# Patient Record
Sex: Female | Born: 1990 | Race: Black or African American | Hispanic: No | Marital: Married | Smoking: Never smoker
Health system: Southern US, Community
[De-identification: ages and names within clinical notes are randomized; demographics above are authoritative.]

## PROBLEM LIST (undated history)

## (undated) DIAGNOSIS — Z789 Other specified health status: Secondary | ICD-10-CM

## (undated) HISTORY — PX: NO PAST SURGERIES: SHX2092

---

## 2018-03-15 ENCOUNTER — Encounter (HOSPITAL_BASED_OUTPATIENT_CLINIC_OR_DEPARTMENT_OTHER): Payer: Self-pay | Admitting: Adult Health

## 2018-03-15 ENCOUNTER — Other Ambulatory Visit: Payer: Self-pay

## 2018-03-15 DIAGNOSIS — B349 Viral infection, unspecified: Secondary | ICD-10-CM | POA: Insufficient documentation

## 2018-03-15 NOTE — ED Triage Notes (Signed)
Three days of flu like illness. She took Ibuprfen at 9 for a fever of 102.0 Boyfriend with same sx.

## 2018-03-16 ENCOUNTER — Emergency Department (HOSPITAL_BASED_OUTPATIENT_CLINIC_OR_DEPARTMENT_OTHER)
Admission: EM | Admit: 2018-03-16 | Discharge: 2018-03-16 | Disposition: A | Discharge status: Home / Self Care | Payer: Self-pay | Attending: Emergency Medicine | Admitting: Emergency Medicine

## 2018-03-16 DIAGNOSIS — B349 Viral infection, unspecified: Secondary | ICD-10-CM

## 2018-03-16 NOTE — ED Provider Notes (Signed)
  [facility] EMERGENCY DEPARTMENT Provider Note   CSN: [id number] Arrival date & time: 03/15/18  2137     History   Chief Complaint Chief Complaint  Patient presents with  . Influenza    HPI Lindsey Long is a 27 y.o. female.  The history is provided by the patient.  Influenza  Presenting symptoms: cough, fatigue, fever, myalgias and sore throat   Presenting symptoms: no diarrhea   Severity:  Moderate Onset quality:  Gradual Duration:  32 days Progression:  Worsening Chronicity:  New Relieved by:  Nothing Worsened by:  Nothing Patient reports flulike illness for 3 days.  She reports fever chills.  She has sick contacts.  No travel.  PMH-none Soc hx - nonsmoker OB History   None      Home Medications    Prior to Admission medications   Not on File    Family History History reviewed. No pertinent family history.  Social History Social History   Tobacco Use  . Smoking status: Not on file  Substance Use Topics  . Alcohol use: Not on file  . Drug use: Not on file     Allergies   Patient has no known allergies.   Review of Systems Review of Systems  Constitutional: Positive for fatigue and fever.  HENT: Positive for sore throat.   Respiratory: Positive for cough.   Gastrointestinal: Negative for diarrhea.  Musculoskeletal: Positive for myalgias.  All other systems reviewed and are negative.    Physical Exam Updated Vital Signs BP 130/86   Pulse 69   Temp 98.3 F (36.8 C) (Oral)   Resp 18   Ht 1.651 m (5\' 5" )   Wt 109.8 kg   LMP 03/03/2018 (Exact Date)   SpO2 99%   BMI 40.27 kg/m   Physical Exam CONSTITUTIONAL: Well developed/well nourished HEAD: Normocephalic/atraumatic EYES: EOMI/PERRL ENMT: Mucous membranes moist, uvula midline, no erythema or exudate NECK: supple no meningeal signs SPINE/BACK:entire spine nontender CV: S1/S2 noted, no murmurs/rubs/gallops noted LUNGS: Lungs are clear to auscultation  bilaterally, no apparent distress ABDOMEN: soft, nontender, no rebound or guarding, bowel sounds noted throughout abdomen GU:no cva tenderness NEURO: Pt is awake/alert/appropriate, moves all extremitiesx4.  No facial droop.   EXTREMITIES: pulses normal/equal, full ROM SKIN: warm, color normal PSYCH: no abnormalities of mood noted, alert and oriented to situation   ED Treatments / Results  Labs (all labs ordered are listed, but only abnormal results are displayed) Labs Reviewed - No data to display  EKG None  Radiology No results found.  Procedures Procedures (including critical care time)  Medications Ordered in ED Medications - No data to display   Initial Impression / Assessment and Plan / ED Course  I have reviewed the triage vital signs and the nursing notes.      Suspect viral illness.  Lung sounds are clear.  Vitals appropriate.  Discharge home.  We discussed strict return precautions  Final Clinical Impressions(s) / ED Diagnoses   Final diagnoses:  Viral syndrome    ED Discharge Orders    None       Zadie Rhine, MD 03/16/18 [phone]

## 2018-03-16 NOTE — ED Notes (Signed)
ED Provider at bedside. 

## 2018-03-16 NOTE — ED Notes (Signed)
Pt understood dc material. NAD noted. 

## 2020-01-28 ENCOUNTER — Encounter (HOSPITAL_BASED_OUTPATIENT_CLINIC_OR_DEPARTMENT_OTHER): Payer: Self-pay

## 2020-01-28 ENCOUNTER — Other Ambulatory Visit: Payer: Self-pay

## 2020-01-28 ENCOUNTER — Emergency Department (HOSPITAL_BASED_OUTPATIENT_CLINIC_OR_DEPARTMENT_OTHER)
Admission: EM | Admit: 2020-01-28 | Discharge: 2020-01-28 | Disposition: A | Discharge status: Home / Self Care | Payer: Self-pay | Attending: Emergency Medicine | Admitting: Emergency Medicine

## 2020-01-28 DIAGNOSIS — Z5321 Procedure and treatment not carried out due to patient leaving prior to being seen by health care provider: Secondary | ICD-10-CM | POA: Insufficient documentation

## 2020-01-28 DIAGNOSIS — R519 Headache, unspecified: Secondary | ICD-10-CM | POA: Insufficient documentation

## 2020-01-28 DIAGNOSIS — R109 Unspecified abdominal pain: Secondary | ICD-10-CM | POA: Insufficient documentation

## 2020-01-28 DIAGNOSIS — R197 Diarrhea, unspecified: Secondary | ICD-10-CM | POA: Insufficient documentation

## 2020-01-28 LAB — URINALYSIS, ROUTINE W REFLEX MICROSCOPIC
Bilirubin Urine: NEGATIVE
Glucose, UA: NEGATIVE mg/dL
Hgb urine dipstick: NEGATIVE
Ketones, ur: NEGATIVE mg/dL
Leukocytes,Ua: NEGATIVE
Nitrite: NEGATIVE
Protein, ur: NEGATIVE mg/dL
Specific Gravity, Urine: 1.02 (ref 1.005–1.030)
pH: 7.5 (ref 5.0–8.0)

## 2020-01-28 LAB — PREGNANCY, URINE: Preg Test, Ur: NEGATIVE

## 2020-01-28 NOTE — ED Triage Notes (Signed)
Pt c/o abd pain/diarrhea started last night-HA x 3 days-NAD-steady gait

## 2021-05-27 ENCOUNTER — Emergency Department (HOSPITAL_BASED_OUTPATIENT_CLINIC_OR_DEPARTMENT_OTHER)
Admission: EM | Admit: 2021-05-27 | Discharge: 2021-05-27 | Disposition: A | Discharge status: Home / Self Care | Payer: Self-pay | Attending: Emergency Medicine | Admitting: Emergency Medicine

## 2021-05-27 ENCOUNTER — Emergency Department (HOSPITAL_BASED_OUTPATIENT_CLINIC_OR_DEPARTMENT_OTHER): Payer: Self-pay

## 2021-05-27 ENCOUNTER — Other Ambulatory Visit: Payer: Self-pay

## 2021-05-27 ENCOUNTER — Encounter (HOSPITAL_BASED_OUTPATIENT_CLINIC_OR_DEPARTMENT_OTHER): Payer: Self-pay | Admitting: *Deleted

## 2021-05-27 DIAGNOSIS — R52 Pain, unspecified: Secondary | ICD-10-CM

## 2021-05-27 DIAGNOSIS — R1031 Right lower quadrant pain: Secondary | ICD-10-CM | POA: Insufficient documentation

## 2021-05-27 DIAGNOSIS — O26891 Other specified pregnancy related conditions, first trimester: Secondary | ICD-10-CM | POA: Insufficient documentation

## 2021-05-27 DIAGNOSIS — Z3A08 8 weeks gestation of pregnancy: Secondary | ICD-10-CM | POA: Insufficient documentation

## 2021-05-27 LAB — URINALYSIS, ROUTINE W REFLEX MICROSCOPIC
Bilirubin Urine: NEGATIVE
Glucose, UA: NEGATIVE mg/dL
Hgb urine dipstick: NEGATIVE
Ketones, ur: 40 mg/dL — AB
Leukocytes,Ua: NEGATIVE
Nitrite: NEGATIVE
Protein, ur: NEGATIVE mg/dL
Specific Gravity, Urine: 1.02 (ref 1.005–1.030)
pH: 7 (ref 5.0–8.0)

## 2021-05-27 LAB — COMPREHENSIVE METABOLIC PANEL
ALT: 12 U/L (ref 0–44)
AST: 20 U/L (ref 15–41)
Albumin: 4 g/dL (ref 3.5–5.0)
Alkaline Phosphatase: 50 U/L (ref 38–126)
Anion gap: 10 (ref 5–15)
BUN: 6 mg/dL (ref 6–20)
CO2: 22 mmol/L (ref 22–32)
Calcium: 9.4 mg/dL (ref 8.9–10.3)
Chloride: 103 mmol/L (ref 98–111)
Creatinine, Ser: 0.73 mg/dL (ref 0.44–1.00)
GFR, Estimated: >60 mL/min (ref >60)
Glucose, Bld: 84 mg/dL (ref 70–99)
Potassium: 3.3 mmol/L — ABNORMAL LOW (ref 3.5–5.1)
Sodium: 135 mmol/L (ref 135–145)
Total Bilirubin: 0.5 mg/dL (ref 0.3–1.2)
Total Protein: 8 g/dL (ref 6.5–8.1)

## 2021-05-27 LAB — CBC WITH DIFFERENTIAL/PLATELET
Abs Immature Granulocytes: 0.01 10*3/uL (ref 0.00–0.07)
Basophils Absolute: 0 10*3/uL (ref 0.0–0.1)
Basophils Relative: 0 %
Eosinophils Absolute: 0.1 10*3/uL (ref 0.0–0.5)
Eosinophils Relative: 1 %
HCT: 39.8 % (ref 36.0–46.0)
Hemoglobin: 13.6 g/dL (ref 12.0–15.0)
Immature Granulocytes: 0 %
Lymphocytes Relative: 30 %
Lymphs Abs: 1.6 10*3/uL (ref 0.7–4.0)
MCH: 28.4 pg (ref 26.0–34.0)
MCHC: 34.2 g/dL (ref 30.0–36.0)
MCV: 83.1 fL (ref 80.0–100.0)
Monocytes Absolute: 0.4 10*3/uL (ref 0.1–1.0)
Monocytes Relative: 8 %
Neutro Abs: 3.1 10*3/uL (ref 1.7–7.7)
Neutrophils Relative %: 61 %
Platelets: 224 10*3/uL (ref 150–400)
RBC: 4.79 MIL/uL (ref 3.87–5.11)
RDW: 13.5 % (ref 11.5–15.5)
WBC: 5.1 10*3/uL (ref 4.0–10.5)
nRBC: 0 % (ref 0.0–0.2)

## 2021-05-27 LAB — HCG, QUANTITATIVE, PREGNANCY: hCG, Beta Chain, Quant, S: 137796 m[IU]/mL — ABNORMAL HIGH (ref <5)

## 2021-05-27 LAB — PREGNANCY, URINE: Preg Test, Ur: POSITIVE — AB

## 2021-05-27 LAB — ABO/RH: ABO/RH(D): O POS

## 2021-05-27 LAB — LIPASE, BLOOD: Lipase: 28 U/L (ref 11–51)

## 2021-05-27 MED ORDER — PRENATAL COMPLETE 14-0.4 MG PO TABS: 1.0000 | ORAL_TABLET | Freq: Every day | ORAL | 0 refills | Status: AC

## 2021-05-27 NOTE — ED Triage Notes (Signed)
Pt reports pain in right side of abdomen/groin that started after she lifted an industrial size trash can lid on Tuesday. Denies n/v/d. States pain is worse with movement

## 2021-05-27 NOTE — Discharge Instructions (Signed)
You were seen in the emergency room today with abdominal pain.  We found you to be pregnant.  The ultrasound shows that the baby is in the correct location and is estimated to be about [redacted] weeks along.  If you develop any fever, burning urination, vaginal discharge or worsening discomfort you should return to the emergency department.  Please establish care with an OB/GYN.  I have attached a prescription for prenatal vitamins.  Return with any new or sudden worsening symptoms.

## 2021-05-27 NOTE — ED Provider Notes (Signed)
Emergency Department Provider Note   I have reviewed the triage vital signs and the nursing notes.   HISTORY  Chief Complaint Groin Pain   HPI Lindsey Long is a 30 y.o. female with past history reviewed below presents the emergency department with right lower quadrant soreness.  Symptoms developed over the last 5 days.  She associates the start of pain with lifting a heavy trash can lid on Tuesday.  She has occasional soreness in that area which is worse with movement although denies that the pain is severe.  No radiation of symptoms.  No vaginal bleeding or discharge.  No concern for sexually transmitted infection.  No dysuria, hesitancy, urgency.  Denies any chest pain radiation into the flank. No prior pregnancy. Last menstrual cycle was in early October.   History reviewed. No pertinent past medical history.  There are no problems to display for this patient.   History reviewed. No pertinent surgical history.  Allergies Patient has no known allergies.  No family history on file.  Social History Social History   Tobacco Use   Smoking status: Never   Smokeless tobacco: Never  Vaping Use   Vaping Use: Never used  Substance Use Topics   Alcohol use: Never   Drug use: Never    Review of Systems  Constitutional: No fever/chills Eyes: No visual changes. ENT: No sore throat. Cardiovascular: Denies chest pain. Respiratory: Denies shortness of breath. Gastrointestinal: Positive RLQ abdominal pain.  No nausea, no vomiting.  No diarrhea.  No constipation. Genitourinary: Negative for dysuria. Musculoskeletal: Negative for back pain. Skin: Negative for rash. Neurological: Negative for headaches, focal weakness or numbness.  10-point ROS otherwise negative.  ____________________________________________   PHYSICAL EXAM:  VITAL SIGNS: ED Triage Vitals  Enc Vitals Group     BP 05/27/21 1417 127/75     Pulse Rate 05/27/21 1417 75     Resp 05/27/21 1417 16      Temp 05/27/21 1417 99.1 F (37.3 C)     Temp Source 05/27/21 1417 Oral     SpO2 05/27/21 1417 100 %     Weight 05/27/21 1417 230 lb (104.3 kg)     Height 05/27/21 1417 5\' 5"  (1.651 m)   Constitutional: Alert and oriented. Well appearing and in no acute distress. Eyes: Conjunctivae are normal.  Head: Atraumatic. Nose: No congestion/rhinnorhea. Mouth/Throat: Mucous membranes are moist.   Neck: No stridor.   Cardiovascular: Normal rate, regular rhythm. Good peripheral circulation. Grossly normal heart sounds.   Respiratory: Normal respiratory effort.  No retractions. Lungs CTAB. Gastrointestinal: Soft and nontender. No distention.  Musculoskeletal: No lower extremity tenderness nor edema. No gross deformities of extremities. Neurologic:  Normal speech and language. No gross focal neurologic deficits are appreciated.  Skin:  Skin is warm, dry and intact. No rash noted.  ____________________________________________   LABS (all labs ordered are listed, but only abnormal results are displayed)  Labs Reviewed  URINALYSIS, ROUTINE W REFLEX MICROSCOPIC - Abnormal; Notable for the following components:      Result Value   APPearance HAZY (*)    Ketones, ur 40 (*)    All other components within normal limits  PREGNANCY, URINE - Abnormal; Notable for the following components:   Preg Test, Ur POSITIVE (*)    All other components within normal limits  HCG, QUANTITATIVE, PREGNANCY - Abnormal; Notable for the following components:   hCG, Beta Chain, Quant, S [id number] (*)    All other components within normal limits  COMPREHENSIVE METABOLIC  PANEL - Abnormal; Notable for the following components:   Potassium 3.3 (*)    All other components within normal limits  LIPASE, BLOOD  CBC WITH DIFFERENTIAL/PLATELET  ABO/RH   ____________________________________________  RADIOLOGY  US OB Comp < 14 Wks  Result Date: 05/27/2021 CLINICAL DATA:  Right upper quadrant pain. Patient pregnant with  quantitative beta HCG pending. Estimated gestational age per LMP 8 weeks 2 days. EXAM: OBSTETRIC <14 WK ULTRASOUND TECHNIQUE: Transabdominal ultrasound was performed for evaluation of the gestation as well as the maternal uterus and adnexal regions. COMPARISON:  None. FINDINGS: Intrauterine gestational sac: Single visualized. Yolk sac:  Visualized. Embryo:  Visualized. Cardiac Activity: Visualized. Heart Rate: 153-180 bpm CRL: 19 mm   8 w 4 d                  [place] EDC: 01/02/2022 Subchorionic hemorrhage:  None visualized. Maternal uterus/adnexae: Right ovary not visualized. Left ovary is normal. 0.6 cm corpus luteal cyst over the left ovary. No free pelvic fluid. IMPRESSION: Single live IUP with estimated gestational age 104 weeks 4 days. Electronically Signed   By: Marin Olp M.D.   On: 05/27/2021 17:29   US OB Transvaginal  Result Date: 05/27/2021 CLINICAL DATA:  Right upper quadrant pain. Patient pregnant with quantitative beta HCG pending. Estimated gestational age per LMP 8 weeks 2 days. EXAM: OBSTETRIC <14 WK ULTRASOUND TECHNIQUE: Transabdominal ultrasound was performed for evaluation of the gestation as well as the maternal uterus and adnexal regions. COMPARISON:  None. FINDINGS: Intrauterine gestational sac: Single visualized. Yolk sac:  Visualized. Embryo:  Visualized. Cardiac Activity: Visualized. Heart Rate: 153-180 bpm CRL: 19 mm   8 w 4 d                  [place] EDC: 01/02/2022 Subchorionic hemorrhage:  None visualized. Maternal uterus/adnexae: Right ovary not visualized. Left ovary is normal. 0.6 cm corpus luteal cyst over the left ovary. No free pelvic fluid. IMPRESSION: Single live IUP with estimated gestational age [redacted] weeks 4 days. Electronically Signed   By: Marin Olp M.D.   On: 05/27/2021 17:29    ____________________________________________   PROCEDURES  Procedure(s) performed:   Procedures  None  ____________________________________________   INITIAL IMPRESSION / ASSESSMENT AND  PLAN / ED COURSE  Pertinent labs & imaging results that were available during my care of the patient were reviewed by me and considered in my medical decision making (see chart for details).   Patient presents to the emergency department with right lower quadrant abdominal pain.  UA and pregnancy from triage show the patient has positive on her urine pregnancy test.  She does not have evidence of urinary tract infection.   Differential diagnosis includes but is not exclusive to ectopic pregnancy, ovarian cyst, ovarian torsion, acute appendicitis, urinary tract infection, endometriosis, bowel obstruction, hernia, colitis, renal colic, gastroenteritis, volvulus etc.  Patient has no peritonitis.  Low suspicion for appendicitis.  Will need labs including quantitative hCG.  No vaginal bleeding but will send ABO Rh as is her first pregnancy.  She will need GYN follow-up.  Will obtain ultrasound to evaluate for intrauterine pregnancy.  Ultrasound shows an intrauterine pregnancy approximately 8 weeks.  No other acute findings.  Labs here are reassuring.  Patient feeling improved.  She will establish care with OB/GYN.  Discussed ED return precautions and follow-up plan. ____________________________________________  FINAL CLINICAL IMPRESSION(S) / ED DIAGNOSES  Final diagnoses:  Right lower quadrant abdominal pain during pregnancy in first trimester  NEW OUTPATIENT MEDICATIONS STARTED DURING THIS VISIT:  New Prescriptions   PRENATAL VIT-FE FUMARATE-FA (PRENATAL COMPLETE) 14-0.4 MG TABS    Take 1 tablet by mouth daily.    Note:  This document was prepared using Dragon voice recognition software and may include unintentional dictation errors.  Alona Bene, MD, [facility] Emergency Medicine    Darriel Sinquefield, Arlyss Repress, MD 05/27/21 [phone]

## 2021-06-20 LAB — OB RESULTS CONSOLE HIV ANTIBODY (ROUTINE TESTING): HIV: NONREACTIVE

## 2021-06-20 LAB — OB RESULTS CONSOLE RUBELLA ANTIBODY, IGM: Rubella: IMMUNE

## 2021-06-20 LAB — OB RESULTS CONSOLE HEPATITIS B SURFACE ANTIGEN: Hepatitis B Surface Ag: NEGATIVE

## 2021-06-22 ENCOUNTER — Other Ambulatory Visit (HOSPITAL_COMMUNITY)
Admission: RE | Admit: 2021-06-22 | Discharge: 2021-06-22 | Disposition: A | Discharge status: Home / Self Care | Payer: BC Managed Care – PPO | Source: Ambulatory Visit | Attending: Obstetrics and Gynecology | Admitting: Obstetrics and Gynecology

## 2021-06-22 DIAGNOSIS — Z3490 Encounter for supervision of normal pregnancy, unspecified, unspecified trimester: Secondary | ICD-10-CM | POA: Diagnosis present

## 2021-06-26 LAB — CYTOLOGY - PAP
Comment: NEGATIVE
Diagnosis: NEGATIVE
High risk HPV: NEGATIVE

## 2021-09-25 LAB — OB RESULTS CONSOLE GC/CHLAMYDIA
Chlamydia: NEGATIVE
Neisseria Gonorrhea: NEGATIVE

## 2021-12-14 LAB — OB RESULTS CONSOLE GBS: GBS: NEGATIVE

## 2021-12-30 ENCOUNTER — Encounter (HOSPITAL_COMMUNITY): Payer: Self-pay

## 2021-12-30 ENCOUNTER — Inpatient Hospital Stay (EMERGENCY_DEPARTMENT_HOSPITAL)
Admission: AD | Admit: 2021-12-30 | Discharge: 2021-12-30 | Disposition: A | Discharge status: Home / Self Care | Payer: BC Managed Care – PPO | Source: Home / Self Care | Attending: Obstetrics and Gynecology | Admitting: Obstetrics and Gynecology

## 2021-12-30 DIAGNOSIS — Z3689 Encounter for other specified antenatal screening: Secondary | ICD-10-CM

## 2021-12-30 DIAGNOSIS — O471 False labor at or after 37 completed weeks of gestation: Secondary | ICD-10-CM

## 2021-12-30 DIAGNOSIS — Z3A39 39 weeks gestation of pregnancy: Secondary | ICD-10-CM

## 2021-12-30 DIAGNOSIS — O4292 Full-term premature rupture of membranes, unspecified as to length of time between rupture and onset of labor: Secondary | ICD-10-CM | POA: Diagnosis not present

## 2021-12-30 HISTORY — DX: Other specified health status: Z78.9

## 2021-12-30 NOTE — MAU Note (Signed)
.  Lindsey Long is a 31 y.o. at [redacted]w[redacted]d here in MAU reporting: ctx on and off since last night. About q 10 min. Denies any vag bleeding or leaking at this time. Good fetal movement reported.  Not dilated on last exam.  Onset of complaint: last night Pain score: 7/10 Vitals:   12/30/21 1937  BP: 138/82  Pulse: 98  Resp: 18  Temp: 98.6 F (37 C)     FHT:140 Lab orders placed from triage:   Mau labor eval

## 2021-12-30 NOTE — MAU Provider Note (Signed)
S: Ms. Sherran Margolis is a 31 y.o. G1P0 at [redacted]w[redacted]d  who presents to MAU today for labor evaluation.   Her cervix was closed and patient requesting discharge.   Cervical exam by RN:  Dilation: Closed Effacement (%): 50 Cervical Position: Posterior Exam by:: K.Wilson,RN  Fetal Monitoring: Baseline: 140 Variability: Moderate Accelerations: Present 15x15 Decelerations: Absent Contractions: None graphed  MDM Discussed patient with RN. NST reviewed.   A: SIUP at [redacted]w[redacted]d  False labor Cat I FT  P: NST Reactive Discharge home Labor precautions and kick counts included in AVS Patient to follow-up with primary ob as scheduled  Patient may return to MAU as needed or when in labor   Gerrit Heck, [place] 12/30/2021 8:09 PM

## 2021-12-31 ENCOUNTER — Other Ambulatory Visit: Payer: Self-pay

## 2021-12-31 ENCOUNTER — Inpatient Hospital Stay (HOSPITAL_COMMUNITY): Payer: BC Managed Care – PPO | Admitting: Anesthesiology

## 2021-12-31 ENCOUNTER — Inpatient Hospital Stay (HOSPITAL_COMMUNITY)
Admission: AD | Admit: 2021-12-31 | Discharge: 2022-01-03 | DRG: 788 | Disposition: A | Discharge status: Home / Self Care | Payer: BC Managed Care – PPO | Attending: Obstetrics and Gynecology | Admitting: Obstetrics and Gynecology

## 2021-12-31 ENCOUNTER — Encounter (HOSPITAL_COMMUNITY): Payer: Self-pay | Admitting: Obstetrics and Gynecology

## 2021-12-31 DIAGNOSIS — O99214 Obesity complicating childbirth: Secondary | ICD-10-CM | POA: Diagnosis present

## 2021-12-31 DIAGNOSIS — O134 Gestational [pregnancy-induced] hypertension without significant proteinuria, complicating childbirth: Secondary | ICD-10-CM | POA: Diagnosis present

## 2021-12-31 DIAGNOSIS — O139 Gestational [pregnancy-induced] hypertension without significant proteinuria, unspecified trimester: Secondary | ICD-10-CM | POA: Diagnosis not present

## 2021-12-31 DIAGNOSIS — R030 Elevated blood-pressure reading, without diagnosis of hypertension: Secondary | ICD-10-CM | POA: Diagnosis not present

## 2021-12-31 DIAGNOSIS — O4292 Full-term premature rupture of membranes, unspecified as to length of time between rupture and onset of labor: Secondary | ICD-10-CM | POA: Diagnosis present

## 2021-12-31 DIAGNOSIS — O26893 Other specified pregnancy related conditions, third trimester: Secondary | ICD-10-CM | POA: Diagnosis present

## 2021-12-31 DIAGNOSIS — Z3A39 39 weeks gestation of pregnancy: Secondary | ICD-10-CM

## 2021-12-31 LAB — CBC
HCT: 32.9 % — ABNORMAL LOW (ref 36.0–46.0)
HCT: 32.7 % — ABNORMAL LOW (ref 36.0–46.0)
Hemoglobin: 11.2 g/dL — ABNORMAL LOW (ref 12.0–15.0)
Hemoglobin: 10.9 g/dL — ABNORMAL LOW (ref 12.0–15.0)
MCH: 27.2 pg (ref 26.0–34.0)
MCH: 26.9 pg (ref 26.0–34.0)
MCHC: 34 g/dL (ref 30.0–36.0)
MCHC: 33.3 g/dL (ref 30.0–36.0)
MCV: 79.9 fL — ABNORMAL LOW (ref 80.0–100.0)
MCV: 80.7 fL (ref 80.0–100.0)
Platelets: 227 10*3/uL (ref 150–400)
Platelets: 222 10*3/uL (ref 150–400)
RBC: 4.12 MIL/uL (ref 3.87–5.11)
RBC: 4.05 MIL/uL (ref 3.87–5.11)
RDW: 15.2 % (ref 11.5–15.5)
RDW: 15.1 % (ref 11.5–15.5)
WBC: 10.1 10*3/uL (ref 4.0–10.5)
WBC: 10.8 10*3/uL — ABNORMAL HIGH (ref 4.0–10.5)
nRBC: 0 % (ref 0.0–0.2)
nRBC: 0 % (ref 0.0–0.2)

## 2021-12-31 LAB — COMPREHENSIVE METABOLIC PANEL
ALT: 11 U/L (ref 0–44)
AST: 19 U/L (ref 15–41)
Albumin: 2.7 g/dL — ABNORMAL LOW (ref 3.5–5.0)
Alkaline Phosphatase: 179 U/L — ABNORMAL HIGH (ref 38–126)
Anion gap: 12 (ref 5–15)
BUN: <5 mg/dL — ABNORMAL LOW (ref 6–20)
CO2: 18 mmol/L — ABNORMAL LOW (ref 22–32)
Calcium: 8.2 mg/dL — ABNORMAL LOW (ref 8.9–10.3)
Chloride: 106 mmol/L (ref 98–111)
Creatinine, Ser: 0.86 mg/dL (ref 0.44–1.00)
GFR, Estimated: >60 mL/min (ref >60)
Glucose, Bld: 102 mg/dL — ABNORMAL HIGH (ref 70–99)
Potassium: 3.4 mmol/L — ABNORMAL LOW (ref 3.5–5.1)
Sodium: 136 mmol/L (ref 135–145)
Total Bilirubin: 0.5 mg/dL (ref 0.3–1.2)
Total Protein: 6.9 g/dL (ref 6.5–8.1)

## 2021-12-31 LAB — TYPE AND SCREEN
ABO/RH(D): O POS
Antibody Screen: NEGATIVE

## 2021-12-31 LAB — POCT FERN TEST: POCT Fern Test: POSITIVE

## 2021-12-31 LAB — RPR: RPR Ser Ql: NONREACTIVE

## 2021-12-31 MED ORDER — FENTANYL-BUPIVACAINE-NACL 0.5-0.125-0.9 MG/250ML-% EP SOLN
EPIDURAL | Status: DC | PRN
  Administered 2021-12-31: 12 mL/h via EPIDURAL

## 2021-12-31 MED ORDER — OXYTOCIN-SODIUM CHLORIDE 30-0.9 UT/500ML-% IV SOLN
1.0000 m[IU]/min | INTRAVENOUS | Status: DC
  Administered 2021-12-31: 2 m[IU]/min via INTRAVENOUS
  Filled 2021-12-31: qty 500

## 2021-12-31 MED ORDER — OXYCODONE-ACETAMINOPHEN 5-325 MG PO TABS: 2.0000 | ORAL_TABLET | ORAL | Status: DC | PRN

## 2021-12-31 MED ORDER — EPHEDRINE 5 MG/ML INJ: 10.0000 mg | INTRAVENOUS | Status: DC | PRN

## 2021-12-31 MED ORDER — OXYTOCIN BOLUS FROM INFUSION: 333.0000 mL | Freq: Once | INTRAVENOUS | Status: DC

## 2021-12-31 MED ORDER — PHENYLEPHRINE 80 MCG/ML (10ML) SYRINGE FOR IV PUSH (FOR BLOOD PRESSURE SUPPORT)
80.0000 ug | PREFILLED_SYRINGE | INTRAVENOUS | Status: DC | PRN
  Administered 2022-01-01: 80 ug via INTRAVENOUS
  Administered 2022-01-01: 160 ug via INTRAVENOUS

## 2021-12-31 MED ORDER — PHENYLEPHRINE 80 MCG/ML (10ML) SYRINGE FOR IV PUSH (FOR BLOOD PRESSURE SUPPORT): 80.0000 ug | PREFILLED_SYRINGE | INTRAVENOUS | Status: DC | PRN

## 2021-12-31 MED ORDER — TERBUTALINE SULFATE 1 MG/ML IJ SOLN
0.2500 mg | Freq: Once | INTRAMUSCULAR | Status: AC | PRN
  Administered 2022-01-01: 0.25 mg via SUBCUTANEOUS
  Filled 2021-12-31: qty 1

## 2021-12-31 MED ORDER — SOD CITRATE-CITRIC ACID 500-334 MG/5ML PO SOLN: 30.0000 mL | ORAL | Status: DC | PRN

## 2021-12-31 MED ORDER — DIPHENHYDRAMINE HCL 50 MG/ML IJ SOLN
12.5000 mg | INTRAMUSCULAR | Status: DC | PRN
  Filled 2021-12-31: qty 1

## 2021-12-31 MED ORDER — LACTATED RINGERS IV SOLN: INTRAVENOUS | Status: DC

## 2021-12-31 MED ORDER — LACTATED RINGERS IV SOLN: 500.0000 mL | INTRAVENOUS | Status: DC | PRN

## 2021-12-31 MED ORDER — ACETAMINOPHEN 325 MG PO TABS: 650.0000 mg | ORAL_TABLET | ORAL | Status: DC | PRN

## 2021-12-31 MED ORDER — LACTATED RINGERS IV SOLN
INTRAVENOUS | Status: DC
Start: 2021-12-31 — End: 2022-01-01

## 2021-12-31 MED ORDER — SOD CITRATE-CITRIC ACID 500-334 MG/5ML PO SOLN
30.0000 mL | ORAL | Status: DC | PRN
  Filled 2021-12-31: qty 30

## 2021-12-31 MED ORDER — ONDANSETRON HCL 4 MG/2ML IJ SOLN: 4.0000 mg | Freq: Four times a day (QID) | INTRAMUSCULAR | Status: DC | PRN

## 2021-12-31 MED ORDER — OXYCODONE-ACETAMINOPHEN 5-325 MG PO TABS: 1.0000 | ORAL_TABLET | ORAL | Status: DC | PRN

## 2021-12-31 MED ORDER — LIDOCAINE HCL (PF) 1 % IJ SOLN
INTRAMUSCULAR | Status: DC | PRN
  Administered 2021-12-31: 5 mL via EPIDURAL

## 2021-12-31 MED ORDER — OXYTOCIN-SODIUM CHLORIDE 30-0.9 UT/500ML-% IV SOLN: 2.5000 [IU]/h | INTRAVENOUS | Status: DC

## 2021-12-31 MED ORDER — FENTANYL CITRATE (PF) 100 MCG/2ML IJ SOLN
50.0000 ug | INTRAMUSCULAR | Status: DC | PRN
  Administered 2021-12-31 (×3): 100 ug via INTRAVENOUS
  Filled 2021-12-31 (×4): qty 2

## 2021-12-31 MED ORDER — FENTANYL-BUPIVACAINE-NACL 0.5-0.125-0.9 MG/250ML-% EP SOLN
12.0000 mL/h | EPIDURAL | Status: DC | PRN
  Administered 2022-01-01: 12 mL/h via EPIDURAL
  Filled 2021-12-31 (×2): qty 250

## 2021-12-31 MED ORDER — LACTATED RINGERS IV SOLN: 500.0000 mL | Freq: Once | INTRAVENOUS | Status: DC

## 2021-12-31 MED ORDER — LIDOCAINE HCL (PF) 1 % IJ SOLN: 30.0000 mL | INTRAMUSCULAR | Status: DC | PRN

## 2021-12-31 MED ORDER — FLEET ENEMA 7-19 GM/118ML RE ENEM: 1.0000 | ENEMA | RECTAL | Status: DC | PRN

## 2021-12-31 NOTE — MAU Note (Signed)
Lindsey Long is a 31 y.o. at [redacted]w[redacted]d here in MAU reporting: her water broke @ approx 0600 this morning, states fluid is white & cloudy.  Also reports having ctxs about 5 minutes apart.  Denies VB.  Endorses +FM.  Onset of complaint: today Pain score: 8/10 Vitals:   12/31/21 0734  BP: 133/83  Pulse: 76  Resp: 20  Temp: 98 F (36.7 C)  SpO2: 100%     FHT: 144 bpm Lab orders placed from triage:

## 2021-12-31 NOTE — Anesthesia Preprocedure Evaluation (Addendum)
Anesthesia Evaluation  Patient identified by MRN, date of birth, ID band Patient awake    Reviewed: Allergy & Precautions, NPO status , Patient's Chart, lab work & pertinent test results  History of Anesthesia Complications Negative for: history of anesthetic complications  Airway Mallampati: II  TM Distance: >3 FB Neck ROM: Full    Dental no notable dental hx. (+) Teeth Intact, Dental Advisory Given   Pulmonary neg pulmonary ROS,    Pulmonary exam normal breath sounds clear to auscultation       Cardiovascular hypertension (gHTN), Normal cardiovascular exam Rhythm:Regular Rate:Normal     Neuro/Psych negative neurological ROS     GI/Hepatic Neg liver ROS,   Endo/Other  Morbid obesity  Renal/GU negative Renal ROS     Musculoskeletal   Abdominal (+) + obese,   Peds  Hematology Lab Results      Component                Value               Date               3                HGB                      11.2 (L)            12/31/2021                HCT                      32.9 (L)            12/31/2021               PLT                      227                 12/31/2021              Anesthesia Other Findings   Reproductive/Obstetrics (+) Pregnancy                           Anesthesia Physical Anesthesia Plan  ASA: 3 and emergent  Anesthesia Plan: Epidural   Post-op Pain Management:    Induction:   PONV Risk Score and Plan: 3 and Ondansetron, Dexamethasone and Treatment may vary due to age or medical condition  Airway Management Planned: Natural Airway  Additional Equipment:   Intra-op Plan:   Post-operative Plan:   Informed Consent: I have reviewed the patients History and Physical, chart, labs and discussed the procedure including the risks, benefits and alternatives for the proposed anesthesia with the patient or authorized representative who has indicated his/her understanding  and acceptance.       Plan Discussed with: CRNA  Anesthesia Plan Comments: (39.3 wk Primagravida w BMI of 42.8 for LEA  Epidural to be used for urgent C/S (NRFHT). Stephannie Peters, MD)      Anesthesia Quick Evaluation

## 2021-12-31 NOTE — Progress Notes (Signed)
Patient ID: Lindsey Long, female   DOB: 01/29/1991, 31 y.o.   MRN: [id number] Pt comfortable with epidural.   BP 123/61 (BP Location: Left Arm)   Pulse (!) 106   Temp 98.6 F (37 C) (Oral)   Resp (P) 18   Ht 5\' 4"  (1.626 m)   Wt 113 kg   LMP 03/30/2021   SpO2 100%   Breastfeeding Unknown   BMI 42.78 kg/m   Cx 2-3/50/-3 puffy cx Cat 1 Toco q 2-3 min' 12 mu pitocin PROM GHTN Pt stable Continue pitocin Anticpate SVD

## 2021-12-31 NOTE — Anesthesia Procedure Notes (Signed)
Epidural Patient location during procedure: OB Start time: 12/31/2021 4:25 PM End time: 12/31/2021 4:43 PM  Staffing Anesthesiologist: Trevor Iha, MD Performed: anesthesiologist   Preanesthetic Checklist Completed: patient identified, IV checked, site marked, risks and benefits discussed, surgical consent, monitors and equipment checked, pre-op evaluation and timeout performed  Epidural Patient position: sitting Prep: DuraPrep and site prepped and draped Patient monitoring: continuous pulse ox and blood pressure Approach: midline Location: L3-L4 Injection technique: LOR air  Needle:  Needle type: Tuohy  Needle gauge: 17 G Needle length: 9 cm and 9 Needle insertion depth: 9 cm Catheter type: closed end flexible Catheter size: 19 Gauge Catheter at skin depth: 15 cm Test dose: negative  Assessment Events: blood not aspirated, injection not painful, no injection resistance, no paresthesia and negative IV test  Additional Notes Patient identified. Risks/Benefits/Options discussed with patient including but not limited to bleeding, infection, nerve damage, paralysis, failed block, incomplete pain control, headache, blood pressure changes, nausea, vomiting, reactions to medication both or allergic, itching and postpartum back pain. Confirmed with bedside nurse the patient's most recent platelet count. Confirmed with patient that they are not currently taking any anticoagulation, have any bleeding history or any family history of bleeding disorders. Patient expressed understanding and wished to proceed. All questions were answered. Sterile technique was used throughout the entire procedure. Please see nursing notes for vital signs. Test dose was given through epidural needle and negative prior to continuing to dose epidural or start infusion. Warning signs of high block given to the patient including shortness of breath, tingling/numbness in hands, complete motor block, or any  concerning symptoms with instructions to call for help. Patient was given instructions on fall risk and not to get out of bed. All questions and concerns addressed with instructions to call with any issues.  2 Attempt (S) . Patient tolerated procedure well.

## 2021-12-31 NOTE — H&P (Signed)
Lindsey Long is a 31 y.o. female, DR Bing Ree pt of [facility] Physician with unremarkable prenatal care is G1P0000, IUP at 39.3 weeks, presenting for SROM with early labor at 0600, clear. GBS-. GC/C/Tric neg. RPR neg, rubella immune, HCV neg, normal 1 h GTT, AA blood, O+, neg antibodies, HIV neg, , last hgb was 11.5 on 4/4. Partner had syphilis over 5 years ago. Pt had h/o chlamydia but not this pregnancy. Pt endorse + Fm. Denies vaginal bleeding.  Patient Active Problem List   Diagnosis Date Noted   Normal labor 12/31/2021     Active Ambulatory Problems    Diagnosis Date Noted   No Active Ambulatory Problems   Resolved Ambulatory Problems    Diagnosis Date Noted   No Resolved Ambulatory Problems   Past Medical History:  Diagnosis Date   Medical history non-contributory       Medications Prior to Admission  Medication Sig Dispense Refill Last Dose   Prenatal Vit-Fe Fumarate-FA (PRENATAL COMPLETE) 14-0.4 MG TABS Take 1 tablet by mouth daily. 30 tablet 0 12/30/2021    Past Medical History:  Diagnosis Date   Medical history non-contributory      No current facility-administered medications on file prior to encounter.   Current Outpatient Medications on File Prior to Encounter  Medication Sig Dispense Refill   Prenatal Vit-Fe Fumarate-FA (PRENATAL COMPLETE) 14-0.4 MG TABS Take 1 tablet by mouth daily. 30 tablet 0     No Known Allergies  OB History     Gravida  1   Para      Term      Preterm      AB      Living         SAB      IAB      Ectopic      Multiple      Live Births             Past Medical History:  Diagnosis Date   Medical history non-contributory    Past Surgical History:  Procedure Laterality Date   NO PAST SURGERIES     Family History: family history is not on file. Social History:  reports that she has never smoked. She has never used smokeless tobacco. She reports that she does not drink alcohol and does not use  drugs.   Prenatal Transfer Tool  Maternal Diabetes: No Genetic Screening: Normal Maternal Ultrasounds/Referrals: Normal Fetal Ultrasounds or other Referrals:  None Maternal Substance Abuse:  No Significant Maternal Medications:  None Significant Maternal Lab Results: Group B Strep negative  ROS:  Review of Systems  Constitutional: Negative.   HENT: Negative.    Eyes: Negative.   Respiratory: Negative.    Cardiovascular: Negative.  Negative for PND.  Gastrointestinal: Negative.   Genitourinary: Negative.   Musculoskeletal: Negative.   Skin: Negative.   Neurological: Negative.   Endo/Heme/Allergies: Negative.   Psychiatric/Behavioral: Negative.       Physical Exam: BP 133/83 (BP Location: Right Arm)   Pulse 76   Temp 98 F (36.7 C) (Oral)   Resp 20   Ht 5\' 4"  (1.626 m)   Wt 113 kg   LMP 03/30/2021   SpO2 100%   BMI 42.78 kg/m   Physical Exam Vitals and nursing note reviewed. Exam conducted with a chaperone present.  Constitutional:      Appearance: Normal appearance.  HENT:     Head: Normocephalic and atraumatic.     Nose: Nose normal.  Mouth/Throat:     Mouth: Mucous membranes are moist.  Eyes:     Pupils: Pupils are equal, round, and reactive to light.  Cardiovascular:     Rate and Rhythm: Normal rate and regular rhythm.     Pulses: Normal pulses.     Heart sounds: Normal heart sounds.  Pulmonary:     Effort: Pulmonary effort is normal.     Breath sounds: Normal breath sounds.  Abdominal:     General: Abdomen is flat.  Genitourinary:    Comments: Uterus gravida, pelvis adequate for vaginal delivery.  Musculoskeletal:        General: Normal range of motion.     Cervical back: Normal range of motion and neck supple.  Skin:    General: Skin is warm.     Capillary Refill: Capillary refill takes less than 2 seconds.  Neurological:     General: No focal deficit present.     Mental Status: She is alert.  Psychiatric:        Mood and Affect: Mood  normal.      NST: FHR baseline 130 bpm, Variability: moderate, Accelerations:present, Decelerations:  Absent= Cat 1/Reactive UC:   irregular, every 5-10 minutes SVE:   Exam by:: J. Emly,CNM, vertex verified by fetal sutures.  Leopold's: Position vertex , EFW 8lbs via leopold's.  Bedside US in mAU verified vertex  Labs: Results for orders placed or performed during the hospital encounter of 12/31/21 (from the past 24 hour(s))  POCT fern test     Status: None   Collection Time: 12/31/21  7:49 AM  Result Value Ref Range   POCT Fern Test Positive = ruptured amniotic membanes     Imaging:  No results found.  MAU Course: Orders Placed This Encounter  Procedures   CBC   RPR   Diet clear liquid Room service appropriate? Yes; Fluid consistency: Thin   Contraction - monitoring   External fetal heart monitoring   Vaginal exam   Vitals signs per unit policy   Notify physician (specify)   Fetal monitoring per unit policy   Activity as tolerated   Cervical Exam   Measure blood pressure post delivery every 15 min x 1 hour then every 30 min x 1 hour   Fundal check post delivery every 15 min x 1 hour then every 30 min x 1 hour   If Rapid HIV test positive or known HIV positive: initiate AZT orders   May in and out cath x 2 for inability to void   Insert urethral catheter X 1 PRN If Coude Catheter is chosen, qualified resources by campus can be found in the clinical skills nursing procedure for Coude Catheter 1. If straight catheterized > 2 times or patient unable to void post epidural plac...   Refer to Sidebar Report Urinary (Foley) Catheter Indications   Refer to Sidebar Report Post Indwelling Urinary Catheter Removal and Intervention Guidelines   Discontinue foley prior to vaginal delivery   Initiate Carrier Fluid Protocol   Initiate Oral Care Protocol   Cervical Exam   Insert urethral catheter X 1 PRN If Coude Catheter is chosen, qualified resources by campus can be found in the  clinical skills nursing procedure for Coude Catheter 1. If straight catheterized > 2 times or patient unable to void post epidural plac...   Full code   POCT fern test   Type and screen [facility]   Insert and maintain IV Line   Admit to Inpatient (patient's  expected length of stay will be greater than 2 midnights or inpatient only procedure)   Meds ordered this encounter  Medications   lactated ringers infusion   oxytocin (PITOCIN) IV BOLUS FROM BAG   oxytocin (PITOCIN) IV infusion 30 units in NS 500 mL - Premix   lactated ringers infusion 500-1,000 mL   acetaminophen (TYLENOL) tablet 650 mg   oxyCODONE-acetaminophen (PERCOCET/ROXICET) 5-325 MG per tablet 1 tablet   oxyCODONE-acetaminophen (PERCOCET/ROXICET) 5-325 MG per tablet 2 tablet   sodium phosphate (FLEET) 7-19 GM/118ML enema 1 enema   ondansetron (ZOFRAN) injection 4 mg   sodium citrate-citric acid (ORACIT) solution 30 mL   lidocaine (PF) (XYLOCAINE) 1 % injection 30 mL    Assessment/Plan: Lindsey Long is a 31 y.o. female, DR Cheryll Cockayne pt of [facility]ician with unremarkable prenatal care is G1P0000, IUP at 39.3 weeks, presenting for SROM with early labor at 0600, clear. GBS-. GC/C/Tric neg. RPR neg, rubella immune, HCV neg, normal 1 h GTT, AA blood, O+, neg antibodies, HIV neg, , last hgb was 11.5 on 4/4. Partner had syphilis over 5 years ago. Pt had h/o chlamydia but not this pregnancy. Pt endorse + Fm. Denies vaginal bleeding.  FWB: Cat 1 Fetal Tracing.   Discussed R/B/A of starting pitocin, pt in early labor with cervical change and desires expectant managements for now.   Plan: Admit to [facility] per consult with DR Charlesetta Garibaldi Routine [facility] orders Pain med/epidural prn Expectant management  Anticipate labor progression   [facility] NP-C, CNM, MSN 12/31/2021, 8:21 AM

## 2021-12-31 NOTE — MAU Provider Note (Signed)
[id number] Lindsey Long 03/11/1991  Patient informed that the ultrasound is considered a limited OB ultrasound and is not intended to be a complete ultrasound exam.  Patient also informed that the ultrasound is not being completed with the intent of assessing for fetal or placental anomalies or any pelvic abnormalities.  Explained that the purpose of today's ultrasound is to assess for  presentation.  Patient acknowledges the purpose of the exam and the limitations of the study.  Cephalic Presentation Noted  Cherre Robins, CNM 12/31/2021, 8:13 AM

## 2021-12-31 NOTE — Progress Notes (Signed)
Labor Progress Note  Lindsey Long is a 31 y.o. female, DR Bing Ree pt of [facility] Physician with unremarkable prenatal care is G1P0000, IUP at 39.3 weeks, presenting for SROM with early labor at 0600, clear. GBS-. GC/C/Tric neg. RPR neg, rubella immune, HCV neg, normal 1 h GTT, AA blood, O+, neg antibodies, HIV neg, , last hgb was 11.5 on 4/4. Partner had syphilis over 5 years ago. Pt had h/o chlamydia but not this pregnancy.  Subjective: Pt stable in bed resting well. Reviewed POC, pt was still not ready for pitocin, but consented to starting it in 2 more hours if her body does not progress, reviewed R/B/A of pitocin. Pt grimacing through cxt but appears to tolerate well.  Patient Active Problem List   Diagnosis Date Noted   Normal labor 12/31/2021   Normal labor and delivery 12/31/2021   Objective: BP 137/78   Pulse (!) 59   Temp 97.9 F (36.6 C) (Oral)   Resp 20   Ht 5\' 4"  (1.626 m)   Wt 113 kg   LMP 03/30/2021   SpO2 100%   BMI 42.78 kg/m  No intake/output data recorded. No intake/output data recorded. NST: FHR baseline 135 bpm, Variability: moderate, Accelerations:present, Decelerations:  Absent= Cat 1/Reactive CTX:  irregular, every 2-5 minutes Uterus gravid, soft non tender, moderate to palpate with contractions.  SVE:  Dilation: 2.5 Effacement (%): 70 Station: -2 Exam by:: [email or web address] CNM Pitocin at N/A mUn/min  Assessment:  Lindsey Long is a 31 y.o. female, DR 26 pt of [facility] Physician with unremarkable prenatal care is G1P0000, IUP at 39.3 weeks, presenting for SROM with early labor at 0600, clear. GBS-. GC/C/Tric neg. RPR neg, rubella immune, HCV neg, normal 1 h GTT, AA blood, O+, neg antibodies, HIV neg, , last hgb was 11.5 on 4/4. Partner had syphilis over 5 years ago. Pt had h/o chlamydia but not this pregnancy. Progressing in early labor.  Patient Active Problem List   Diagnosis Date Noted   Normal labor 12/31/2021   Normal labor and delivery 12/31/2021    NICHD: Category 1  Membranes: SROM clear at 0600, no s/s of infection  Induction:    Cytotec x N/A  Foley Bulb: Rupture no foley  Pitocin - Plan to start in 2 hours.   Pain management:               IV pain management: x Fentanyl 01/02/2022 @ 0915, 1250  Nitrous: PRN             Epidural placement: PRN  GBS Negative   Plan: Continue labor plan Continuous monitoring Rest Ambulate Elevated BP with no dx made. Will monitor.  Frequent position changes to facilitate fetal rotation and descent. Will reassess with cervical exam at 1700 or earlier if necessary Start pitocin per protocol 2x2 in 2 hours per pt request.  Anticipate labor progression and vaginal delivery.   , NP-C, CNM, MSN 12/31/2021. 3:07 PM

## 2021-12-31 NOTE — Progress Notes (Signed)
Labor Progress Note  Lindsey Long is a 31 y.o. female, DR Bing Ree pt of [facility] Physician with unremarkable prenatal care is G1P0000, IUP at 39.3 weeks, presenting for SROM with early labor at 0600, clear. GBS-. GC/C/Tric neg. RPR neg, rubella immune, HCV neg, normal 1 h GTT, AA blood, O+, neg antibodies, HIV neg, , last hgb was 11.5 on 4/4. Partner had syphilis over 5 years ago. Pt had h/o chlamydia but not this pregnancy.  Subjective: Pt stable in bed resting well. Comfortable post epidural. Continue pitocin Augmentin. Ptr on peanut ball.  Patient Active Problem List   Diagnosis Date Noted   Normal labor 12/31/2021   Normal labor and delivery 12/31/2021   Gestational hypertension 12/31/2021   Objective: BP (!) 121/56   Pulse 61   Temp 98.3 F (36.8 C) (Oral)   Resp 20   Ht 5\' 4"  (1.626 m)   Wt 113 kg   LMP 03/30/2021   SpO2 100%   Breastfeeding Unknown   BMI 42.78 kg/m  No intake/output data recorded. No intake/output data recorded. NST: FHR baseline 135 bpm, Variability: moderate, Accelerations:present, Decelerations:  Absent= Cat 1/Reactive CTX:  irregular, every 2-5 minutes Uterus gravid, soft non tender, moderate to palpate with contractions.  SVE:  Dilation: 3 Effacement (%): 70 Station: -2 Exam by:: [email or web address] RN Pitocin at 12 mUn/min  Assessment:  Lindsey Long is a 31 y.o. female, DR 26 pt of [facility] Physician with unremarkable prenatal care is G1P0000, IUP at 39.3 weeks, presenting for SROM with early labor at 0600, clear. GBS-. GC/C/Tric neg. RPR neg, rubella immune, HCV neg, normal 1 h GTT, AA blood, O+, neg antibodies, HIV neg, , last hgb was 11.5 on 4/4. Partner had syphilis over 5 years ago. Pt had h/o chlamydia but not this pregnancy. Progressing in early labor.  Patient Active Problem List   Diagnosis Date Noted   Normal labor 12/31/2021   Normal labor and delivery 12/31/2021   Gestational hypertension 12/31/2021   NICHD: Category  1  Membranes: SROM clear at 0600, no s/s of infection  Induction:    Cytotec x N/A  Foley Bulb: Rupture no foley  Pitocin - 12  Pain management:               IV pain management: x Fentanyl 01/02/2022 @ 0915, 1250, 1505  Nitrous: PRN             Epidural placement: Placed at 1625 on 7/16.   GBS Negative  GHTN: 112/52, CBC. CMP WNL, pending PCR.    Plan: Continue labor plan Continuous monitoring Rest Ambulate GHTN: monitor BP, PCR pending.  Frequent position changes to facilitate fetal rotation and descent. Will reassess with cervical exam at 4 hours or earlier if necessary Continue pitocin per protocol 2x2 Anticipate labor progression and vaginal delivery.   Dr 8/16 will be updated and assume care at 1900 for pt.   Normand Sloop, NP-C, CNM, MSN 12/31/2021. 6:13 PM

## 2022-01-01 ENCOUNTER — Other Ambulatory Visit: Payer: Self-pay

## 2022-01-01 ENCOUNTER — Encounter (HOSPITAL_COMMUNITY): Payer: Self-pay | Admitting: Obstetrics and Gynecology

## 2022-01-01 ENCOUNTER — Encounter (HOSPITAL_COMMUNITY)
Admission: AD | Disposition: A | Discharge status: Home / Self Care | Payer: Self-pay | Source: Home / Self Care | Attending: Obstetrics and Gynecology

## 2022-01-01 SURGERY — Surgical Case
Anesthesia: Epidural

## 2022-01-01 MED ORDER — ACETAMINOPHEN 500 MG PO TABS: 1000.0000 mg | ORAL_TABLET | Freq: Once | ORAL | Status: DC

## 2022-01-01 MED ORDER — WITCH HAZEL-GLYCERIN EX PADS: 1.0000 | MEDICATED_PAD | CUTANEOUS | Status: DC | PRN

## 2022-01-01 MED ORDER — MISOPROSTOL 200 MCG PO TABS
ORAL_TABLET | ORAL | Status: AC
  Filled 2022-01-01: qty 5

## 2022-01-01 MED ORDER — ONDANSETRON HCL 4 MG/2ML IJ SOLN
INTRAMUSCULAR | Status: AC
  Filled 2022-01-01: qty 4

## 2022-01-01 MED ORDER — SODIUM CHLORIDE 0.9 % IV SOLN
500.0000 mg | INTRAVENOUS | Status: AC
  Administered 2022-01-01: 500 mg via INTRAVENOUS
  Filled 2022-01-01: qty 5

## 2022-01-01 MED ORDER — CEFAZOLIN SODIUM-DEXTROSE 2-4 GM/100ML-% IV SOLN
INTRAVENOUS | Status: AC
  Filled 2022-01-01: qty 100

## 2022-01-01 MED ORDER — MORPHINE SULFATE (PF) 0.5 MG/ML IJ SOLN
INTRAMUSCULAR | Status: DC | PRN
  Administered 2022-01-01: 3 mg via EPIDURAL

## 2022-01-01 MED ORDER — KETOROLAC TROMETHAMINE 30 MG/ML IJ SOLN: 30.0000 mg | Freq: Once | INTRAMUSCULAR | Status: DC

## 2022-01-01 MED ORDER — DIPHENHYDRAMINE HCL 50 MG/ML IJ SOLN: 12.5000 mg | INTRAMUSCULAR | Status: DC | PRN

## 2022-01-01 MED ORDER — OXYCODONE HCL 5 MG PO TABS: 5.0000 mg | ORAL_TABLET | ORAL | Status: DC | PRN

## 2022-01-01 MED ORDER — DEXAMETHASONE SODIUM PHOSPHATE 4 MG/ML IJ SOLN
INTRAMUSCULAR | Status: DC | PRN
  Administered 2022-01-01: 4 mg via INTRAVENOUS

## 2022-01-01 MED ORDER — ACETAMINOPHEN 500 MG PO TABS
1000.0000 mg | ORAL_TABLET | Freq: Four times a day (QID) | ORAL | Status: AC
  Filled 2022-01-01 (×3): qty 2

## 2022-01-01 MED ORDER — SOD CITRATE-CITRIC ACID 500-334 MG/5ML PO SOLN: 30.0000 mL | ORAL | Status: DC

## 2022-01-01 MED ORDER — ZOLPIDEM TARTRATE 5 MG PO TABS: 5.0000 mg | ORAL_TABLET | Freq: Every evening | ORAL | Status: DC | PRN

## 2022-01-01 MED ORDER — OXYTOCIN-SODIUM CHLORIDE 30-0.9 UT/500ML-% IV SOLN: 2.5000 [IU]/h | INTRAVENOUS | Status: AC

## 2022-01-01 MED ORDER — NALOXONE HCL 4 MG/10ML IJ SOLN: 1.0000 ug/kg/h | INTRAVENOUS | Status: DC | PRN

## 2022-01-01 MED ORDER — NALOXONE HCL 0.4 MG/ML IJ SOLN: 0.4000 mg | INTRAMUSCULAR | Status: DC | PRN

## 2022-01-01 MED ORDER — DIPHENOXYLATE-ATROPINE 2.5-0.025 MG PO TABS
2.0000 | ORAL_TABLET | Freq: Once | ORAL | Status: AC
Start: 2022-01-01 — End: 2022-01-01
  Administered 2022-01-01: 2 via ORAL

## 2022-01-01 MED ORDER — MENTHOL 3 MG MT LOZG: 1.0000 | LOZENGE | OROMUCOSAL | Status: DC | PRN

## 2022-01-01 MED ORDER — ONDANSETRON HCL 4 MG/2ML IJ SOLN: 4.0000 mg | Freq: Three times a day (TID) | INTRAMUSCULAR | Status: DC | PRN

## 2022-01-01 MED ORDER — DIBUCAINE (PERIANAL) 1 % EX OINT: 1.0000 | TOPICAL_OINTMENT | CUTANEOUS | Status: DC | PRN

## 2022-01-01 MED ORDER — SODIUM CHLORIDE 0.9% FLUSH: 3.0000 mL | INTRAVENOUS | Status: DC | PRN

## 2022-01-01 MED ORDER — DROPERIDOL 2.5 MG/ML IJ SOLN: 0.6250 mg | Freq: Once | INTRAMUSCULAR | Status: DC | PRN

## 2022-01-01 MED ORDER — DIPHENOXYLATE-ATROPINE 2.5-0.025 MG PO TABS
ORAL_TABLET | ORAL | Status: AC
  Filled 2022-01-01: qty 1

## 2022-01-01 MED ORDER — CEFAZOLIN SODIUM-DEXTROSE 2-3 GM-%(50ML) IV SOLR
INTRAVENOUS | Status: DC | PRN
  Administered 2022-01-01: 2 g via INTRAVENOUS

## 2022-01-01 MED ORDER — OXYTOCIN-SODIUM CHLORIDE 30-0.9 UT/500ML-% IV SOLN
INTRAVENOUS | Status: DC | PRN
  Administered 2022-01-01: 400 mL via INTRAVENOUS

## 2022-01-01 MED ORDER — ACETAMINOPHEN 10 MG/ML IV SOLN
INTRAVENOUS | Status: DC | PRN
  Administered 2022-01-01: 1000 mg via INTRAVENOUS

## 2022-01-01 MED ORDER — ACETAMINOPHEN 160 MG/5ML PO SOLN: 1000.0000 mg | Freq: Once | ORAL | Status: DC

## 2022-01-01 MED ORDER — SIMETHICONE 80 MG PO CHEW: 80.0000 mg | CHEWABLE_TABLET | ORAL | Status: DC | PRN

## 2022-01-01 MED ORDER — FENTANYL CITRATE (PF) 100 MCG/2ML IJ SOLN: 25.0000 ug | INTRAMUSCULAR | Status: DC | PRN

## 2022-01-01 MED ORDER — DIPHENHYDRAMINE HCL 25 MG PO CAPS: 25.0000 mg | ORAL_CAPSULE | ORAL | Status: DC | PRN

## 2022-01-01 MED ORDER — DIPHENHYDRAMINE HCL 25 MG PO CAPS: 25.0000 mg | ORAL_CAPSULE | Freq: Four times a day (QID) | ORAL | Status: DC | PRN

## 2022-01-01 MED ORDER — SIMETHICONE 80 MG PO CHEW
80.0000 mg | CHEWABLE_TABLET | Freq: Three times a day (TID) | ORAL | Status: DC
  Administered 2022-01-02 – 2022-01-03 (×5): 80 mg via ORAL
  Filled 2022-01-01 (×5): qty 1

## 2022-01-01 MED ORDER — CARBOPROST TROMETHAMINE 250 MCG/ML IM SOLN
INTRAMUSCULAR | Status: AC
  Filled 2022-01-01: qty 1

## 2022-01-01 MED ORDER — HYDROMORPHONE HCL 1 MG/ML IJ SOLN: 0.2000 mg | INTRAMUSCULAR | Status: DC | PRN

## 2022-01-01 MED ORDER — FERROUS SULFATE 325 (65 FE) MG PO TABS
325.0000 mg | ORAL_TABLET | Freq: Two times a day (BID) | ORAL | Status: DC
  Administered 2022-01-01 – 2022-01-03 (×4): 325 mg via ORAL
  Filled 2022-01-01 (×4): qty 1

## 2022-01-01 MED ORDER — MISOPROSTOL 200 MCG PO TABS
1000.0000 ug | ORAL_TABLET | Freq: Once | ORAL | Status: DC
Start: 2022-01-01 — End: 2022-01-01
  Administered 2022-01-01: 1000 ug via RECTAL

## 2022-01-01 MED ORDER — COCONUT OIL OIL: 1.0000 | TOPICAL_OIL | Status: DC | PRN

## 2022-01-01 MED ORDER — DIPHENHYDRAMINE HCL 50 MG/ML IJ SOLN
25.0000 mg | Freq: Once | INTRAMUSCULAR | Status: AC
Start: 2022-01-01 — End: 2022-01-01
  Administered 2022-01-01: 25 mg via INTRAVENOUS

## 2022-01-01 MED ORDER — PRENATAL MULTIVITAMIN CH
1.0000 | ORAL_TABLET | Freq: Every day | ORAL | Status: DC
Start: 2022-01-01 — End: 2022-01-03
  Administered 2022-01-02 – 2022-01-03 (×2): 1 via ORAL
  Filled 2022-01-01 (×2): qty 1

## 2022-01-01 MED ORDER — TRANEXAMIC ACID-NACL 1000-0.7 MG/100ML-% IV SOLN
INTRAVENOUS | Status: DC | PRN
  Administered 2022-01-01: 1000 mg via INTRAVENOUS

## 2022-01-01 MED ORDER — FENTANYL CITRATE (PF) 100 MCG/2ML IJ SOLN
INTRAMUSCULAR | Status: DC | PRN
  Administered 2022-01-01: 100 ug via EPIDURAL

## 2022-01-01 MED ORDER — DEXAMETHASONE SODIUM PHOSPHATE 4 MG/ML IJ SOLN
INTRAMUSCULAR | Status: AC
  Filled 2022-01-01: qty 1

## 2022-01-01 MED ORDER — CARBOPROST TROMETHAMINE 250 MCG/ML IM SOLN
INTRAMUSCULAR | Status: DC | PRN
  Administered 2022-01-01: 250 ug via INTRAMUSCULAR

## 2022-01-01 MED ORDER — SENNOSIDES-DOCUSATE SODIUM 8.6-50 MG PO TABS
2.0000 | ORAL_TABLET | Freq: Every day | ORAL | Status: DC
  Administered 2022-01-02 – 2022-01-03 (×2): 2 via ORAL
  Filled 2022-01-01 (×2): qty 2

## 2022-01-01 MED ORDER — MORPHINE SULFATE (PF) 0.5 MG/ML IJ SOLN
INTRAMUSCULAR | Status: AC
  Filled 2022-01-01: qty 10

## 2022-01-01 MED ORDER — ONDANSETRON HCL 4 MG/2ML IJ SOLN
INTRAMUSCULAR | Status: DC | PRN
  Administered 2022-01-01: 4 mg via INTRAVENOUS

## 2022-01-01 MED ORDER — ACETAMINOPHEN 500 MG PO TABS
1000.0000 mg | ORAL_TABLET | Freq: Four times a day (QID) | ORAL | Status: DC
  Administered 2022-01-01 – 2022-01-03 (×7): 1000 mg via ORAL
  Filled 2022-01-01 (×4): qty 2

## 2022-01-01 MED ORDER — IBUPROFEN 600 MG PO TABS
600.0000 mg | ORAL_TABLET | Freq: Four times a day (QID) | ORAL | Status: DC
  Administered 2022-01-01 – 2022-01-03 (×8): 600 mg via ORAL
  Filled 2022-01-01 (×8): qty 1

## 2022-01-01 MED ORDER — LIDOCAINE-EPINEPHRINE (PF) 2 %-1:200000 IJ SOLN
INTRAMUSCULAR | Status: DC | PRN
  Administered 2022-01-01: 5 mL via EPIDURAL
  Administered 2022-01-01: 2 mL via EPIDURAL
  Administered 2022-01-01: 5 mL via EPIDURAL
  Administered 2022-01-01: 2 mL via EPIDURAL

## 2022-01-01 MED ORDER — CEFAZOLIN SODIUM-DEXTROSE 2-4 GM/100ML-% IV SOLN: 2.0000 g | INTRAVENOUS | Status: DC

## 2022-01-01 SURGICAL SUPPLY — 39 items
BARRIER ADHS 3X4 INTERCEED (GAUZE/BANDAGES/DRESSINGS) ×1 IMPLANT
BENZOIN TINCTURE PRP APPL 2/3 (GAUZE/BANDAGES/DRESSINGS) ×2 IMPLANT
BINDER ABDOMINAL 10 UNV 27-48 (MISCELLANEOUS) IMPLANT
BINDER ABDOMINAL 12 ML 46-62 (SOFTGOODS) IMPLANT
CHLORAPREP W/TINT 26ML (MISCELLANEOUS) ×4 IMPLANT
CLAMP CORD UMBIL (MISCELLANEOUS) ×2 IMPLANT
CLOTH BEACON ORANGE TIMEOUT ST (SAFETY) ×2 IMPLANT
DRSG OPSITE POSTOP 4X10 (GAUZE/BANDAGES/DRESSINGS) ×2 IMPLANT
ELECT REM PT RETURN 9FT ADLT (ELECTROSURGICAL) ×2
ELECTRODE REM PT RTRN 9FT ADLT (ELECTROSURGICAL) ×1 IMPLANT
EXTRACTOR VACUUM KIWI (MISCELLANEOUS) IMPLANT
GLOVE BIOGEL M 7.0 STRL (GLOVE) ×4 IMPLANT
GLOVE BIOGEL PI IND STRL 7.0 (GLOVE) ×3 IMPLANT
GLOVE BIOGEL PI INDICATOR 7.0 (GLOVE) ×3
GOWN STRL REUS W/TWL LRG LVL3 (GOWN DISPOSABLE) ×6 IMPLANT
KIT ABG SYR 3ML LUER SLIP (SYRINGE) IMPLANT
NDL HYPO 25X5/8 SAFETYGLIDE (NEEDLE) IMPLANT
NEEDLE HYPO 25X5/8 SAFETYGLIDE (NEEDLE) IMPLANT
NS IRRIG 1000ML POUR BTL (IV SOLUTION) ×2 IMPLANT
PACK C SECTION WH (CUSTOM PROCEDURE TRAY) ×2 IMPLANT
PAD OB MATERNITY 4.3X12.25 (PERSONAL CARE ITEMS) ×2 IMPLANT
RTRCTR C-SECT PINK 25CM LRG (MISCELLANEOUS) IMPLANT
SPONGE T-LAP 18X18 ~~LOC~~+RFID (SPONGE) ×3 IMPLANT
STRIP CLOSURE SKIN 1/2X4 (GAUZE/BANDAGES/DRESSINGS) ×2 IMPLANT
SUT MNCRL 0 VIOLET CTX 36 (SUTURE) ×2 IMPLANT
SUT MONOCRYL 0 CTX 36 (SUTURE) ×2
SUT PDS AB 0 CT1 27 (SUTURE) ×4 IMPLANT
SUT PLAIN 0 NONE (SUTURE) IMPLANT
SUT PLAIN 2 0 (SUTURE) ×1
SUT PLAIN ABS 2-0 CT1 27XMFL (SUTURE) IMPLANT
SUT VIC AB 2-0 CT1 (SUTURE) ×1 IMPLANT
SUT VIC AB 2-0 CT1 27 (SUTURE) ×1
SUT VIC AB 2-0 CT1 TAPERPNT 27 (SUTURE) ×1 IMPLANT
SUT VIC AB 3-0 SH 27 (SUTURE)
SUT VIC AB 3-0 SH 27X BRD (SUTURE) IMPLANT
SUT VIC AB 4-0 KS 27 (SUTURE) ×2 IMPLANT
TOWEL OR 17X24 6PK STRL BLUE (TOWEL DISPOSABLE) ×2 IMPLANT
TRAY FOLEY W/BAG SLVR 14FR LF (SET/KITS/TRAYS/PACK) ×2 IMPLANT
WATER STERILE IRR 1000ML POUR (IV SOLUTION) ×2 IMPLANT

## 2022-01-01 NOTE — Lactation Note (Signed)
This note was copied from a baby's chart. Lactation Consultation Note  Patient Name: Lindsey Long [id number] Date: 01/01/2022 Reason for consult: Initial assessment;Mother's request;Difficult latch;Term;Breastfeeding assistance Age:31 years  LC assisted with latching infant not able to get enough depth, doing a lot of tongue sucking. Best latch position to try to get more depth cross cradle prone.   Plan 1. To feed based on cues 8-12x 24 hr period. Mom to offer breasts and look for signs of milk transfer.  2. Mom to supplement with eBM first followed by formula with pace bottle feeding and yellow slow flow nipple 5-7 ml per feeding.  3. Post pump after feeding for 15 mins.  Mom like to try pumping first before using formula.   Maternal Data Has patient been taught Hand Expression?: Yes Does the patient have breastfeeding experience prior to this delivery?: No  Feeding Mother's Current Feeding Choice: Breast Milk  LATCH Score Latch: Repeated attempts needed to sustain latch, nipple held in mouth throughout feeding, stimulation needed to elicit sucking reflex.  Audible Swallowing: None  Type of Nipple: Everted at rest and after stimulation (bifurcated large nipples)  Comfort (Breast/Nipple): Soft / non-tender  Hold (Positioning): Assistance needed to correctly position infant at breast and maintain latch.  LATCH Score: 6   Lactation Tools Discussed/Used Tools: Pump;Flanges Flange Size: 24;27 Breast pump type: Double-Electric Breast Pump Pump Education: Setup, frequency, and cleaning;Milk Storage Reason for Pumping: increase stimulation Pumping frequency: post pump after each feeding for 15 mins  Interventions Interventions: Breast feeding basics reviewed;Assisted with latch;Skin to skin;Breast massage;Hand express;Breast compression;Adjust position;Support pillows;Position options;Expressed milk;DEBP;Education;Pace feeding;LC Psychologist, educational;Infant Driven Feeding  Algorithm education  Discharge Pump: DEBP;Personal (Motiff pump at home) [facility] Program: No  Consult Status Consult Status: Follow-up Date: 01/02/22 Follow-up type: In-patient    Holden Maniscalco  Nicholson-Springer 01/01/2022, 7:53 PM

## 2022-01-01 NOTE — Progress Notes (Signed)
Patient ID: Lindsey Long, female   DOB: January 31, 1991, 31 y.o.   MRN: [id number] Pt comfortable BP 133/79 (BP Location: Left Arm)   Pulse (!) 116   Temp 98.5 F (36.9 C)   Resp 16   Ht 5\' 4"  (1.626 m)   Wt 113 kg   LMP 03/30/2021   SpO2 100%   Breastfeeding Unknown   BMI 42.78 kg/m  3-4/50/-2 Cat 1 Continue current care. Pt is stable

## 2022-01-01 NOTE — Progress Notes (Signed)
Dayshift RN called midwife regarding persistently slightly elevated blood pressure (see vital signs); midwife in delivery at this time.  Nightshift RN aware of blood pressure and is planning to call again soon.  Patient's blood pressure is asymptomatic at this time.  (No headache, no visual disturbances, etc.)

## 2022-01-01 NOTE — Op Note (Signed)
Cesarean Section Procedure Note  Indications: non-reassuring fetal status  Pre-operative Diagnosis: 39 week 4 day pregnancy.  Post-operative Diagnosis: same  Surgeon: Gerald Leitz M.D.  Assistants: Steva Ready D.O  Anesthesia: Epidural anesthesia  ASA Class: 2   Procedure Details   The patient was seen in the Holding Room. The risks, benefits, complications, treatment options, and expected outcomes were discussed with the patient.  The patient concurred with the proposed plan, giving informed consent.  The site of surgery properly noted/marked. The patient was taken to Operating Room # C, identified as Lindsey Long and the procedure verified as C-Section Delivery. A Time Out was held and the above information confirmed.  After induction of anesthesia, the patient was draped and prepped in the usual sterile manner. A Pfannenstiel incision was made and carried down through the subcutaneous tissue to the fascia. Fascial incision was made and extended transversely. The fascia was separated from the underlying rectus tissue superiorly and inferiorly. The peritoneum was identified and entered. Peritoneal incision was extended longitudinally. The utero-vesical peritoneal reflection was incised transversely and the bladder flap was bluntly freed from the lower uterine segment. A low transverse uterine incision was made. Delivered from cephalic presentation was a   Female with Apgar scores of 9 at one minute and 9 at five minutes. After the umbilical cord was clamped and cut cord blood was obtained for evaluation. The placenta was removed intact and appeared normal. The uterine outline, tubes and ovaries appeared normal. The uterine incision was closed with running locked sutures of  0 monocryl. A second layer of 0 monocryl was used to imbricate the incision  . Hemostasis was observed. Lavage was carried out until clear. Interseed was placed along the uterine incision. The peritoneum was  re-approximated 2-0 vicryl. The fascia was then reapproximated with running sutures of  0 pds  . The skin was reapproximated with  4-0 vicryl  .  Instrument, sponge, and needle counts were correct prior the abdominal closure and at the conclusion of the case.   Findings: Female infant in the cephalic presentation. Clear amniotic fluid. Normal appearing fallopian tubes and ovaries.   Estimated Blood Loss:   approximately 1000 mL          Drains: Foley Catheter          Total IV Fluids:  per anesthesia ml         Specimens: Placenta to labor and delivery           Implants: None         Complications:  None; patient tolerated the procedure well.         Disposition: PACU - hemodynamically stable.         Condition: stable  Attending Attestation: I performed the procedure.

## 2022-01-01 NOTE — Progress Notes (Signed)
Lindsey Long, CNM notified of persistently mildly elevated BP throughout the day. Lindsey Long stated understanding and will round on pt.  Verbal order written to notify provider for systolic BP 160 or greater and/or diastolic 110 or greater OR with symptoms (headache, blurry vision, RUQ pain). Pt asymptomatic and in no acute distress. All questions and concerns addressed by this writer from pt and her support person.   Elvia Collum, RN 01/01/22

## 2022-01-01 NOTE — Transfer of Care (Signed)
Immediate Anesthesia Transfer of Care Note  Patient: Lindsey Long  Procedure(s) Performed: CESAREAN SECTION  Patient Location: PACU  Anesthesia Type:Epidural  Level of Consciousness: awake, alert  and oriented  Airway & Oxygen Therapy: Patient Spontanous Breathing  Post-op Assessment: Report given to RN and Post -op Vital signs reviewed and stable  Post vital signs: Reviewed and stable  Last Vitals:  Vitals Value Taken Time  BP 124/72 01/01/22 1032  Temp    Pulse 85 01/01/22 1036  Resp 18 01/01/22 1036  SpO2 100 % 01/01/22 1036  Vitals shown include unvalidated device data.  Last Pain:  Vitals:   01/01/22 0743  TempSrc: Oral  PainSc:       Patients Stated Pain Goal: 0 (01/01/22 0700)  Complications: No notable events documented.

## 2022-01-01 NOTE — Progress Notes (Signed)
   01/01/22 0700  What Happened  Was fall witnessed? No  Was patient injured? No  Patient found on floor  Found by Staff-comment  Stated prior activity other (comment) (Sleeping)  Follow Up  MD notified Yes  Time MD notified [phone]  Simple treatment Other (comment) (None required)  Adult Fall Risk Assessment  Patient Fall Risk Level High fall risk  Vitals  BP Location Left Arm  BP Method Automatic  Patient Position (if appropriate) Lying  Pulse Rate Source Dinamap  Resp 18  Pain Assessment  Pain Scale 0-10  Pain Score 0  Pain Type Other (Comment) (Pt states no pain at this time.)  Patients Stated Pain Goal 0  Pain Intervention(s) RN made aware  Multiple Pain Sites No  PCA/Epidural/Spinal Assessment  Respiratory Pattern Regular  Patient/Family Education Done  Epidural Specific Assessment  Dressing/Site/Catheter Assessment Clean;Dry;Intact;Catheter patent  Epidural Precautions Infusion pump locked  Epidural Side Effect Assessment None  Color/Movement/Sensation (CMS) Normal  Neurological  Neuro (WDL) WDL  Level of Consciousness Alert  Musculoskeletal  Musculoskeletal (WDL) WDL  Assistive Device None  Integumentary  Integumentary (WDL) WDL  Pain Assessment  Date Pain First Started 01/01/22 (No pain stated at this time)  Result of Injury No  Pain Assessment  Work-Related Injury No  Pain Screening  Response to Interventions No pain stated at this time

## 2022-01-01 NOTE — Anesthesia Postprocedure Evaluation (Signed)
Anesthesia Post Note  Patient: Chartered certified accountant  Procedure(s) Performed: CESAREAN SECTION     Patient location during evaluation: PACU Anesthesia Type: Epidural Level of consciousness: awake and alert Pain management: pain level controlled Vital Signs Assessment: post-procedure vital signs reviewed and stable Respiratory status: spontaneous breathing, nonlabored ventilation and respiratory function stable Cardiovascular status: blood pressure returned to baseline Postop Assessment: epidural receding, no apparent nausea or vomiting, no headache and no backache Anesthetic complications: no   No notable events documented.  Last Vitals:  Vitals:   01/01/22 1130 01/01/22 1245  BP: (!) 147/71 (!) 141/79  Pulse: 63   Resp: 19 18  Temp:  37.2 C  SpO2: 97%     Last Pain:  Vitals:   01/01/22 1245  TempSrc: Oral  PainSc:    Pain Goal: Patients Stated Pain Goal: 0 (01/01/22 0700)                 Shanda Howells

## 2022-01-01 NOTE — Progress Notes (Signed)
   Subjective:  Pt feeling pressure for the last hour. Feeling pain with contractions despite epidural.   Objective: BP (!) 112/99   Pulse (!) 103   Temp 99.3 F (37.4 C) (Oral)   Resp 18   Ht 5\' 4"  (1.626 m)   Wt 113 kg   LMP 03/30/2021   SpO2 98%   Breastfeeding Unknown   BMI 42.78 kg/m  No intake/output data recorded. No intake/output data recorded.  FHT:  FHR: 135 bpm, variability: moderate,  accelerations:  Present,  decelerations:  Present variable and late decelerations  UC:   regular, every 2-3 minutes SVE:   4/50/0 station cervix swollen   Labs: Lab Results  Component Value Date   WBC 10.8 (H) 12/31/2021   HGB 10.9 (L) 12/31/2021   HCT 32.7 (L) 12/31/2021   MCV 80.7 12/31/2021   PLT 222 12/31/2021    Assessment / Plan: Augmentation of labor with nonreassuring fetal heart rate.  Pitocin discontinued. Terbutaline given.  Recommend cesarean section. R/B/A of cesarean section discussed with the patient including but not limited to infection, bleeding damage to bowel bladder and baby with the need for further surgery. R/O transfusion HIV/ Hep B&C discussed. Pt voiced understanding and desires to proceed with cesarean section.

## 2022-01-02 LAB — CBC
HCT: 26.6 % — ABNORMAL LOW (ref 36.0–46.0)
Hemoglobin: 8.8 g/dL — ABNORMAL LOW (ref 12.0–15.0)
MCH: 27.1 pg (ref 26.0–34.0)
MCHC: 33.1 g/dL (ref 30.0–36.0)
MCV: 81.8 fL (ref 80.0–100.0)
Platelets: 189 10*3/uL (ref 150–400)
RBC: 3.25 MIL/uL — ABNORMAL LOW (ref 3.87–5.11)
RDW: 15.3 % (ref 11.5–15.5)
WBC: 13 10*3/uL — ABNORMAL HIGH (ref 4.0–10.5)
nRBC: 0 % (ref 0.0–0.2)

## 2022-01-02 LAB — BIRTH TISSUE RECOVERY COLLECTION (PLACENTA DONATION)

## 2022-01-02 MED ORDER — KETOROLAC TROMETHAMINE 30 MG/ML IJ SOLN: 30.0000 mg | Freq: Four times a day (QID) | INTRAMUSCULAR | Status: DC | PRN

## 2022-01-02 MED ORDER — NIFEDIPINE ER OSMOTIC RELEASE 30 MG PO TB24
30.0000 mg | ORAL_TABLET | Freq: Every day | ORAL | Status: DC
Start: 2022-01-02 — End: 2022-01-03
  Administered 2022-01-02 – 2022-01-03 (×2): 30 mg via ORAL
  Filled 2022-01-02 (×2): qty 1

## 2022-01-02 NOTE — Progress Notes (Signed)
Subjective: Postpartum Day 1: Cesarean Delivery Patient reports incisional pain, tolerating PO, + flatus, and no problems voiding.    Objective: Vital signs in last 24 hours: Temp:  [97.6 F (36.4 C)-98.4 F (36.9 C)] 97.6 F (36.4 C) (07/18 1230) Pulse Rate:  [63-71] 71 (07/18 1230) Resp:  [16-18] 16 (07/18 0530) BP: (123-145)/(74-90) 143/85 (07/18 1230) SpO2:  [99 %-100 %] 100 % (07/18 1230)  Physical Exam:  General: alert, cooperative, and no distress Lochia: appropriate Uterine Fundus: firm Incision: healing well DVT Evaluation: No evidence of DVT seen on physical exam.  Recent Labs    12/31/21 1528 01/02/22 0542  HGB 10.9* 8.8*  HCT 32.7* 26.6*    Assessment/Plan: Status post Cesarean section. Doing well postoperatively.  Encouraged ambulation  Gestational hypertension - start procardia xl 30 mg daily  Possible discharge home tomorrow .  Gerald Leitz, MD 01/02/2022, 2:32 PM

## 2022-01-03 MED ORDER — NIFEDIPINE ER 30 MG PO TB24: 30.0000 mg | ORAL_TABLET | Freq: Every day | ORAL | 1 refills | Status: AC

## 2022-01-03 MED ORDER — IBUPROFEN 600 MG PO TABS: 600.0000 mg | ORAL_TABLET | Freq: Four times a day (QID) | ORAL | 1 refills | Status: AC | PRN

## 2022-01-03 MED ORDER — OXYCODONE HCL 5 MG PO TABS
5.0000 mg | ORAL_TABLET | ORAL | 0 refills | Status: AC | PRN
Start: 2022-01-03 — End: ?

## 2022-01-03 MED ORDER — ACETAMINOPHEN 500 MG PO TABS: 1000.0000 mg | ORAL_TABLET | Freq: Three times a day (TID) | ORAL | 0 refills | Status: AC | PRN

## 2022-01-03 NOTE — Discharge Summary (Signed)
Postpartum Discharge Summary  Date of Service updated 01/03/2022     Patient Name: Lindsey Long DOB: 08/17/1990 MRN: [id number]  Date of admission: 12/31/2021 Delivery date:01/01/2022  Delivering provider: Christophe Louis  Date of discharge: 01/03/2022  Admitting diagnosis: Normal labor [O80, Z37.9] Normal labor and delivery [O80] Intrauterine pregnancy: [redacted]w[redacted]d    Secondary diagnosis:  Principal Problem:   Normal labor Active Problems:   Normal labor and delivery   Gestational hypertension  Additional problems: None    Discharge diagnosis: Term Pregnancy Delivered and Gestational Hypertension                                              Post partum procedures: None Augmentation: Pitocin Complications: None  Hospital course: Onset of Labor With Unplanned C/S   31y.o. yo G1P1001 at 314w4das admitted in Latent Labor on 12/31/2021. Patient had a labor course significant for pitocin for augmentation . The patient went for cesarean section due to Non-Reassuring FHR. Delivery details as follows: Membrane Rupture Time/Date: 6:00 AM ,12/31/2021   Delivery Method:C-Section, Low Transverse  Details of operation can be found in separate operative note. Patient had an uncomplicated postpartum course.  She is ambulating,tolerating a regular diet, passing flatus, and urinating well.  Patient is discharged home in stable condition 01/03/22.  Newborn Data: Birth date:01/01/2022  Birth time:9:20 AM  Gender:Female  Living status:Living  Apgars:9 ,9  Weight:3580 g   Magnesium Sulfate received: No BMZ received: No Rhophylac:N/A MMR:N/A T-DaP:Given prenatally Flu: N/A Transfusion:No  Physical exam  Vitals:   01/02/22 1503 01/02/22 2058 01/03/22 0522 01/03/22 1456  BP: 140/80 (!) 147/82 135/82 (!) 142/81  Pulse: 68 78 86 80  Resp:   18 16  Temp:  98.6 F (37 C) 98.3 F (36.8 C)   TempSrc:  Oral Oral Oral  SpO2:  100% 99% 100%  Weight:      Height:       General: alert,  cooperative, and no distress Lochia: appropriate Uterine Fundus: firm Incision: Dressing is clean, dry, and intact DVT Evaluation: No evidence of DVT seen on physical exam. Labs: Lab Results  Component Value Date   WBC 13.0 (H) 01/02/2022   HGB 8.8 (L) 01/02/2022   HCT 26.6 (L) 01/02/2022   MCV 81.8 01/02/2022   PLT 189 01/02/2022      Latest Ref Rng & Units 12/31/2021    3:28 PM  CMP  Glucose 70 - 99 mg/dL 102   BUN 6 - 20 mg/dL <5   Creatinine 0.44 - 1.00 mg/dL 0.86   Sodium 135 - 145 mmol/L 136   Potassium 3.5 - 5.1 mmol/L 3.4   Chloride 98 - 111 mmol/L 106   CO2 22 - 32 mmol/L 18   Calcium 8.9 - 10.3 mg/dL 8.2   Total Protein 6.5 - 8.1 g/dL 6.9   Total Bilirubin 0.3 - 1.2 mg/dL 0.5   Alkaline Phos 38 - 126 U/L 179   AST 15 - 41 U/L 19   ALT 0 - 44 U/L 11    Edinburgh Score:    01/02/2022    8:35 AM  Edinburgh Postnatal Depression Scale Screening Tool  I have been able to laugh and see the funny side of things. 0  I have looked forward with enjoyment to things. 0  I have blamed myself unnecessarily when things went  wrong. 0  I have been anxious or worried for no good reason. 0  I have felt scared or panicky for no good reason. 1  Things have been getting on top of me. 1  I have been so unhappy that I have had difficulty sleeping. 1  I have felt sad or miserable. 1  I have been so unhappy that I have been crying. 0  The thought of harming myself has occurred to me. 0  Edinburgh Postnatal Depression Scale Total 4      After visit meds:  Allergies as of 01/03/2022   No Known Allergies      Medication List     TAKE these medications    acetaminophen 500 MG tablet Commonly known as: TYLENOL Take 2 tablets (1,000 mg total) by mouth every 8 (eight) hours as needed.   ibuprofen 600 MG tablet Commonly known as: ADVIL Take 1 tablet (600 mg total) by mouth every 6 (six) hours as needed.   NIFEdipine 30 MG 24 hr tablet Commonly known as: ADALAT CC Take 1  tablet (30 mg total) by mouth daily for 60 doses.   oxyCODONE 5 MG immediate release tablet Commonly known as: Oxy IR/ROXICODONE Take 1-2 tablets (5-10 mg total) by mouth every 4 (four) hours as needed for moderate pain.   Prenatal Complete 14-0.4 MG Tabs Take 1 tablet by mouth daily.         Discharge home in stable condition Infant Feeding: Bottle and Breast Infant Disposition:home with mother Discharge instruction: per After Visit Summary and Postpartum booklet. Activity: Advance as tolerated. Pelvic rest for 6 weeks.  Diet: routine diet Anticipated Birth Control: Unsure Postpartum Appointment:1 week Additional Postpartum F/U: BP check 1 week Future Appointments:No future appointments. Follow up Visit:  Follow-up Information     Christophe Louis, MD. Schedule an appointment as soon as possible for a visit in 2 week(s).   Specialty: Obstetrics and Gynecology Why: make an appt with Dr. Landry Mellow in 2 weeks for an incision check Contact information: [address] [place] [address] [phone]                     01/03/2022 Christophe Louis, MD

## 2022-01-09 ENCOUNTER — Telehealth (HOSPITAL_COMMUNITY): Payer: Self-pay | Admitting: *Deleted

## 2022-01-09 NOTE — Telephone Encounter (Signed)
Patient voiced no questions or concerns regarding her health at this time. EPDS=10. Score faxed to patient's care provider, Dr. Gerald Leitz. Patient agreed for RN to fax a list of mental health resources. Patient voiced no questions or concerns regarding infant at this time. Patient reports infant sleeps in a crib on her back. RN reviewed ABCs of safe sleep. Patient verbalized understanding. Patient requested RN email information on hospital's virtual postpartum classes and support groups. Email sent. Deforest Hoyles, RN, 01/09/22, [phone]

## 2023-06-27 IMAGING — US US OB COMP LESS 14 WK
1 series · 14 of 28 positions shown · non-contrast
Comparison: None.

CLINICAL DATA: Right upper quadrant pain. Patient pregnant with
quantitative beta HCG pending. Estimated gestational age per LMP 8
weeks 2 days.

EXAM:
OBSTETRIC <14 WK ULTRASOUND
TECHNIQUE: Transabdominal ultrasound was performed for evaluation of the
gestation as well as the maternal uterus and adnexal regions.

[Series 1: us ob comp less 14 wk · 14 of 41 slices shown]
[im 2/41]
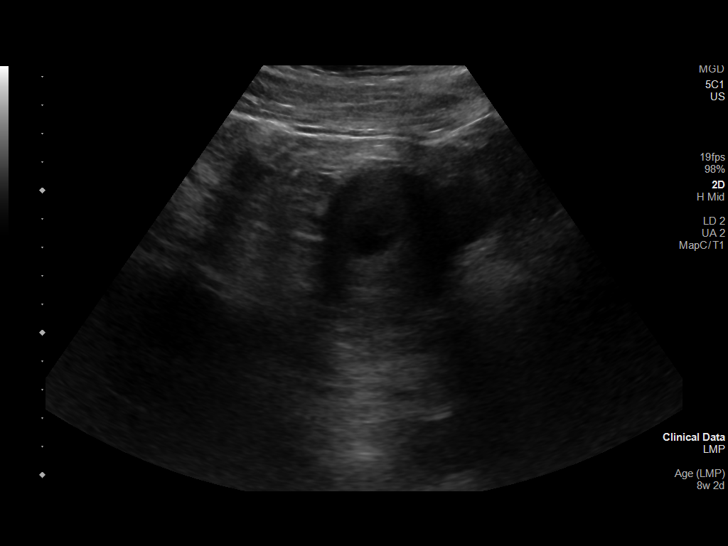
[im 5/41]
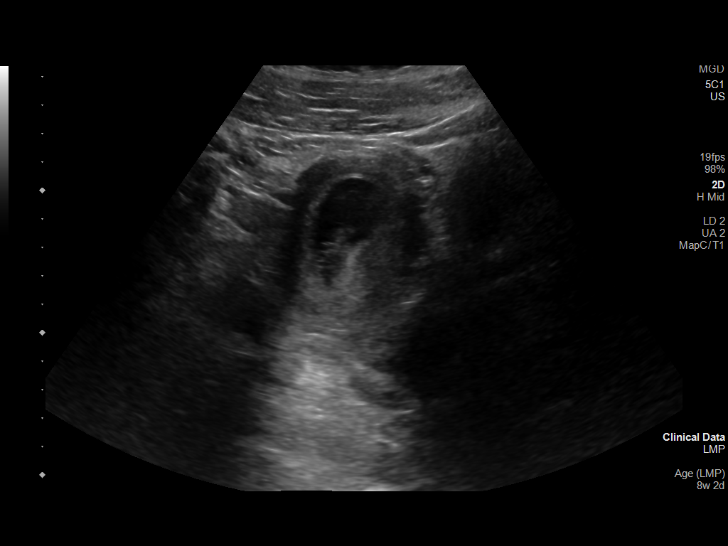
[im 8/41]
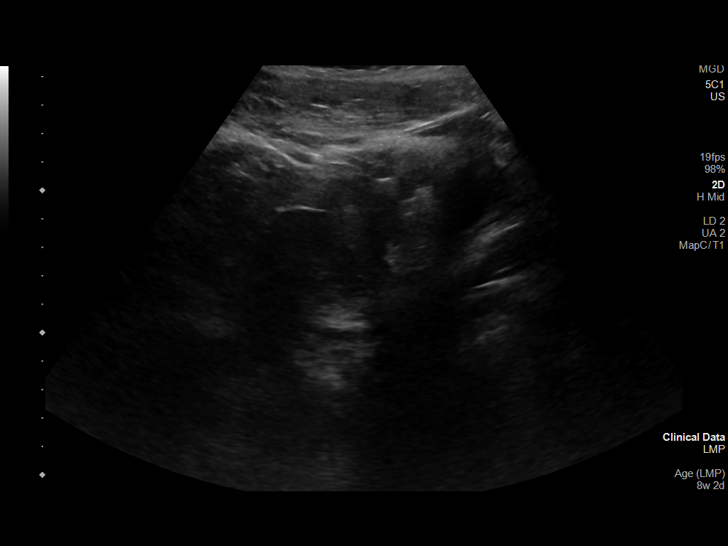
[im 11/41]
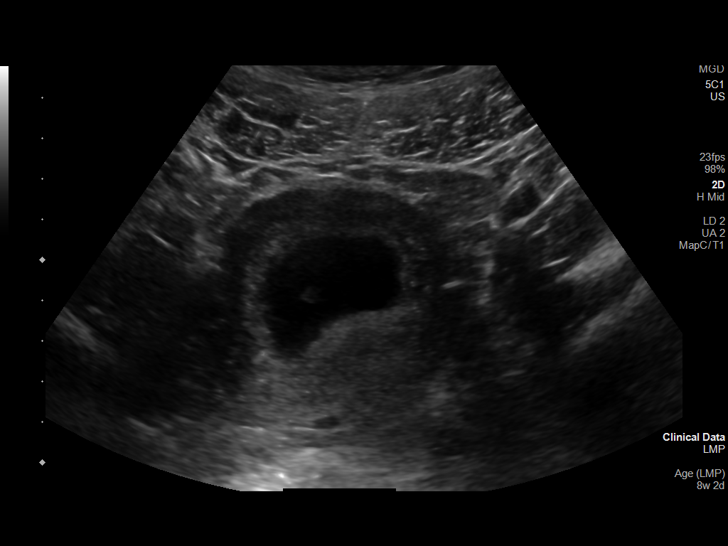
[im 14/41]
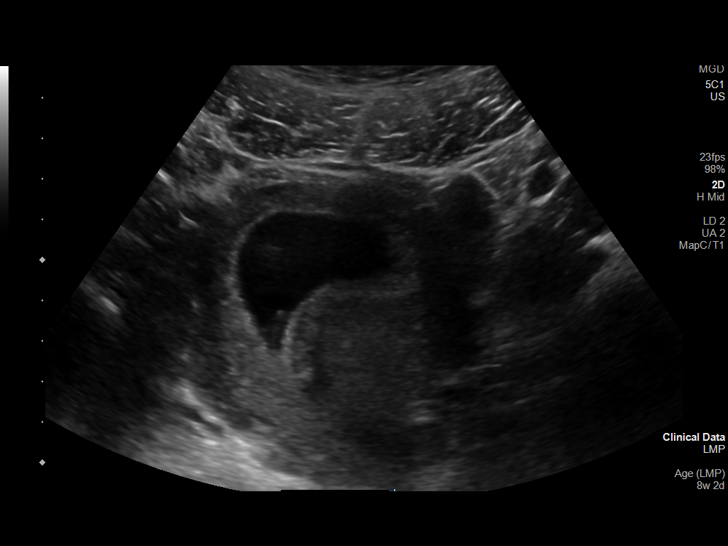
[im 17/41]
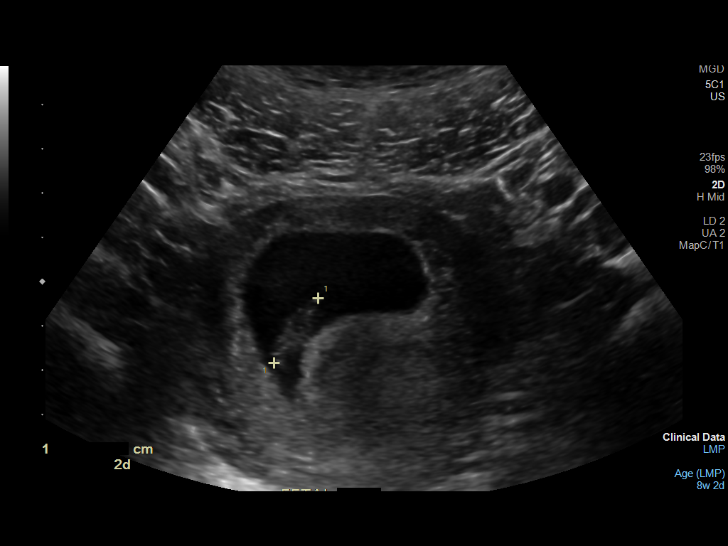
[im 20/41]
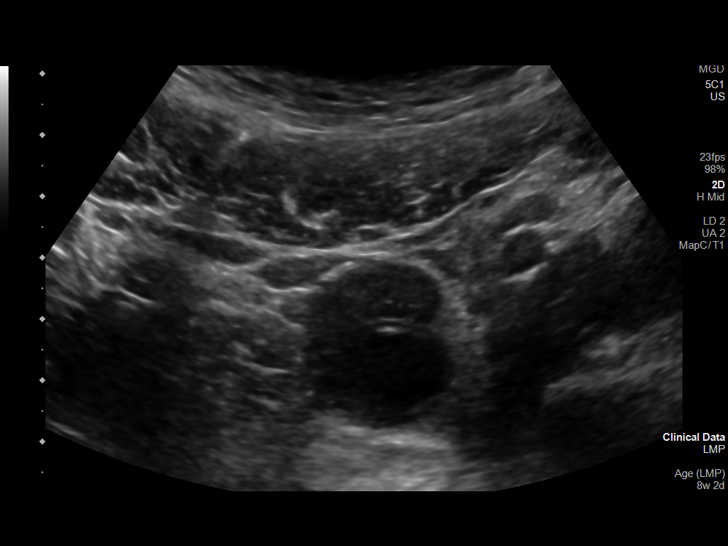
[im 23/41]
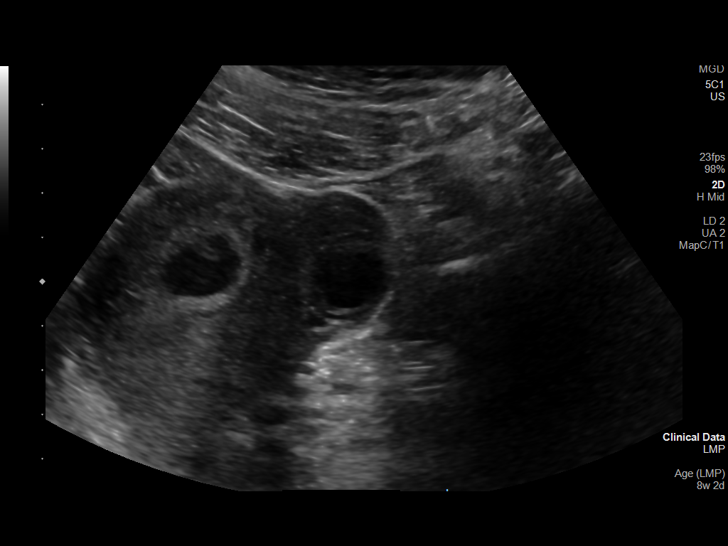
[im 26/41]
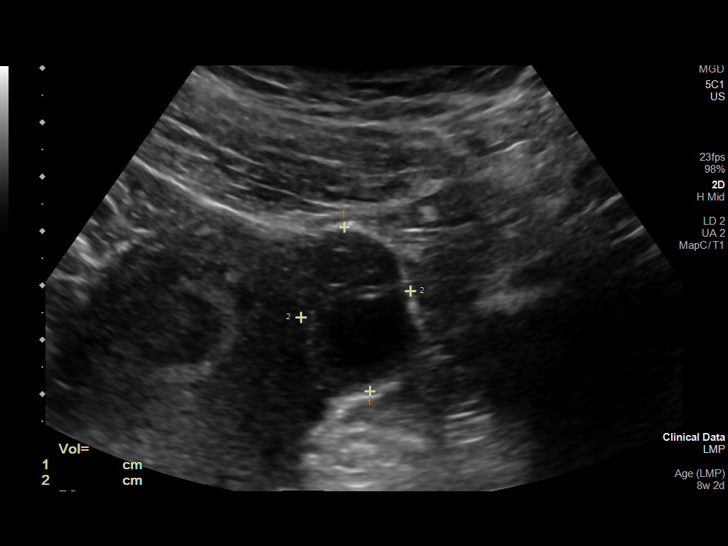
[im 29/41]
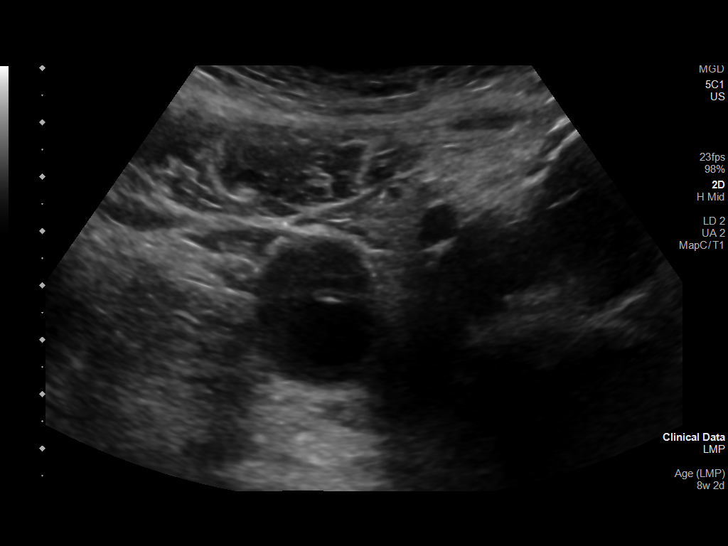
[im 32/41]
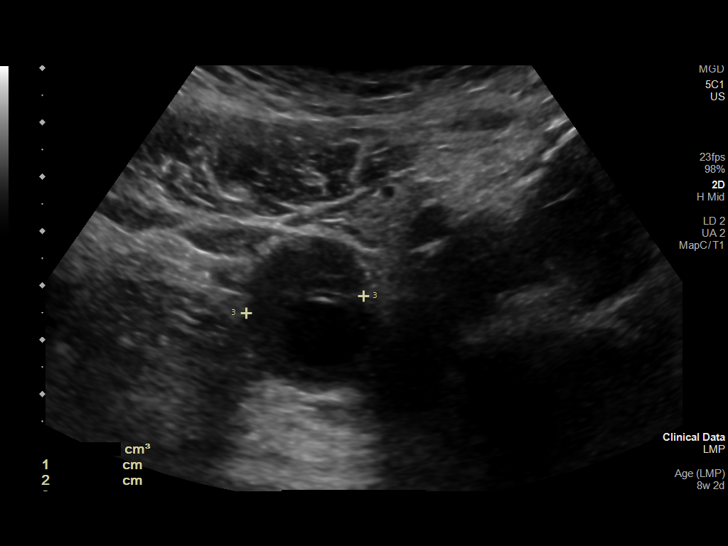
[im 35/41]
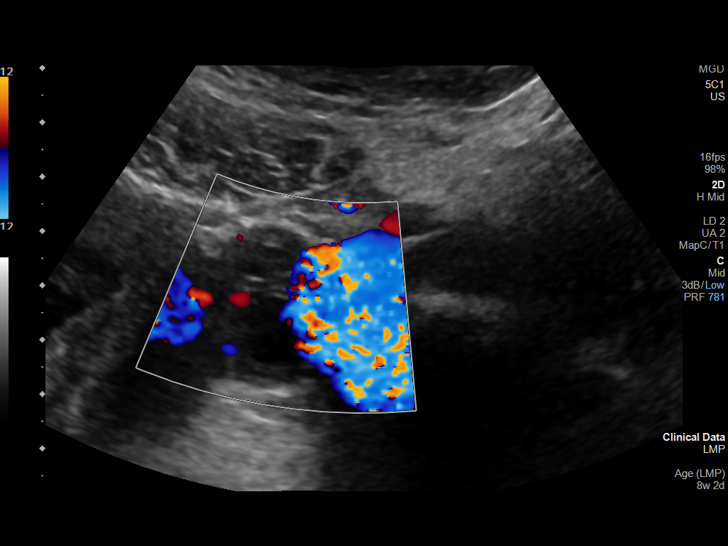
[im 38/41]
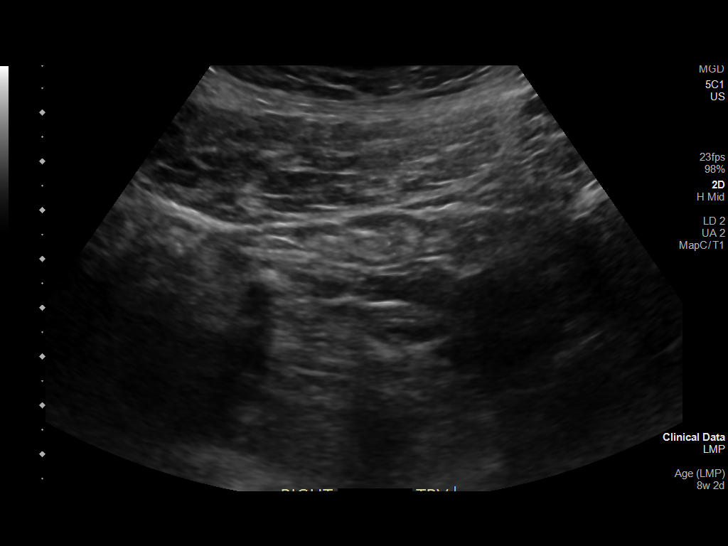
[im 41/41]
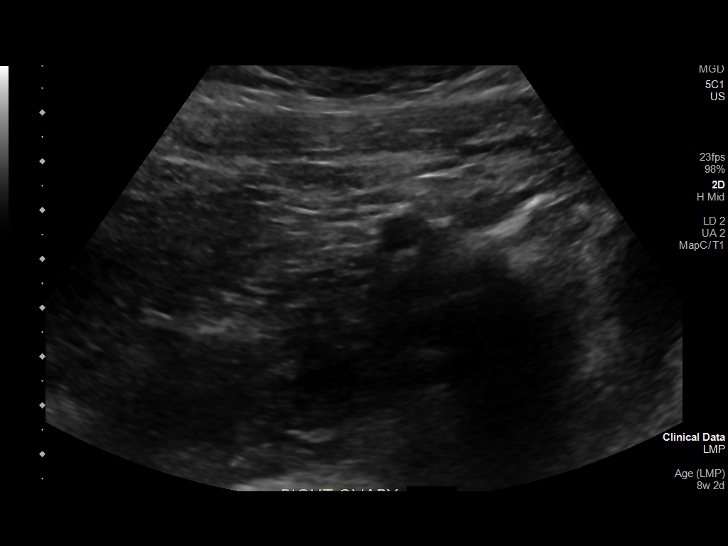

[14 of 28 positions shown; findings below may reference images not displayed]

FINDINGS: Intrauterine gestational sac: Single visualized.

Yolk sac:  Visualized.

Embryo:  Visualized.

Cardiac Activity: Visualized.

Heart Rate: 153-180 bpm

CRL: 19 mm   8 w 4 d                  US EDC: 01/02/2022

Subchorionic hemorrhage:  None visualized.

Maternal uterus/adnexae: Right ovary not visualized. Left ovary is
normal. 0.6 cm corpus luteal cyst over the left ovary. No free
pelvic fluid.
IMPRESSION: Single live IUP with estimated gestational age 8 weeks 4 days.

## 2023-06-27 IMAGING — US US OB TRANSVAGINAL
1 series · 14 of 28 positions shown · non-contrast
Comparison: None.

CLINICAL DATA: Right upper quadrant pain. Patient pregnant with
quantitative beta HCG pending. Estimated gestational age per LMP 8
weeks 2 days.

EXAM:
OBSTETRIC <14 WK ULTRASOUND
TECHNIQUE: Transabdominal ultrasound was performed for evaluation of the
gestation as well as the maternal uterus and adnexal regions.

[Series 1: us ob transvaginal · 53 acquisitions, 14 frames shown]
[im 2/53]
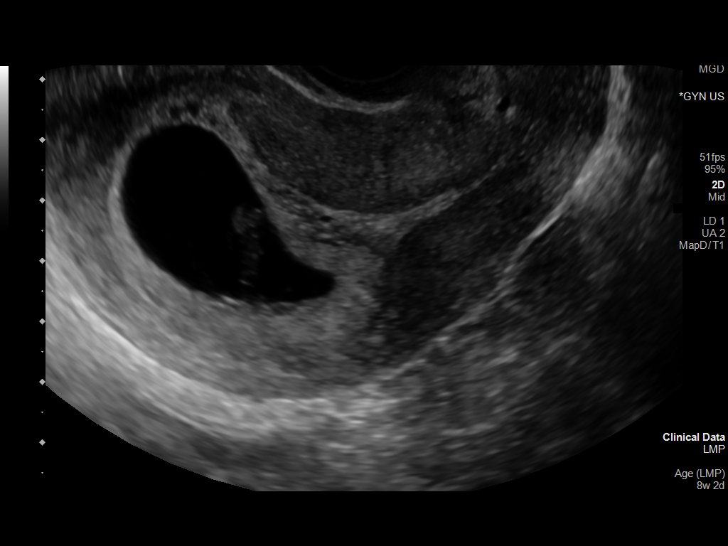
[im 6/53]
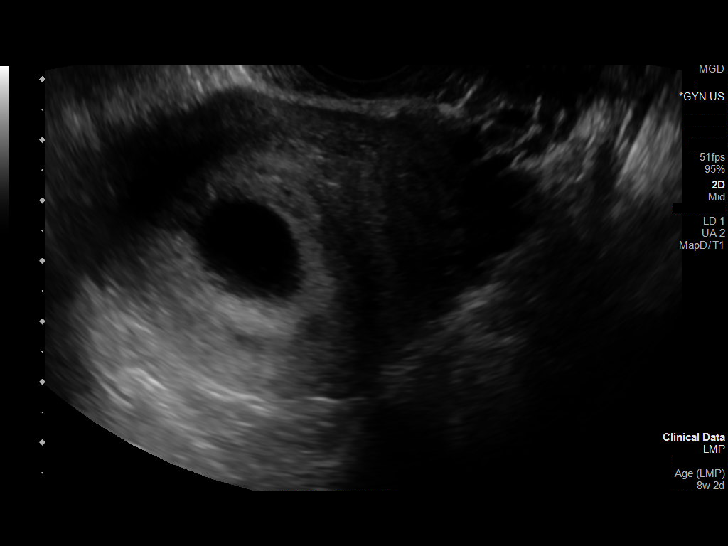
[im 10/53]
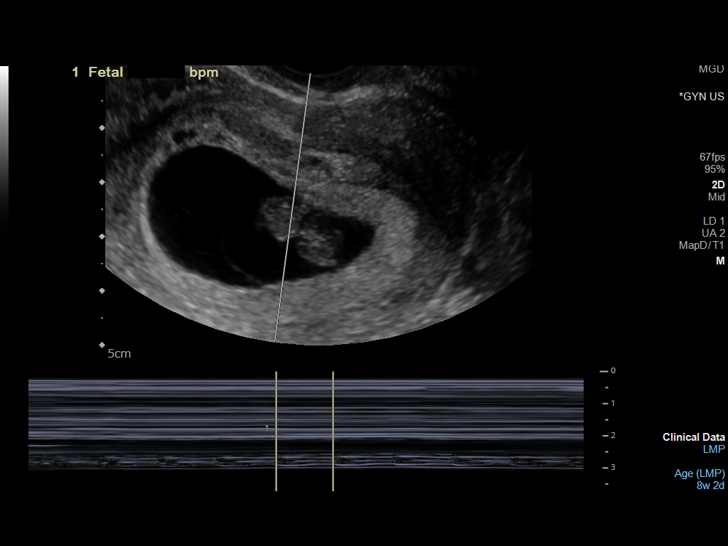
[im 14/53]
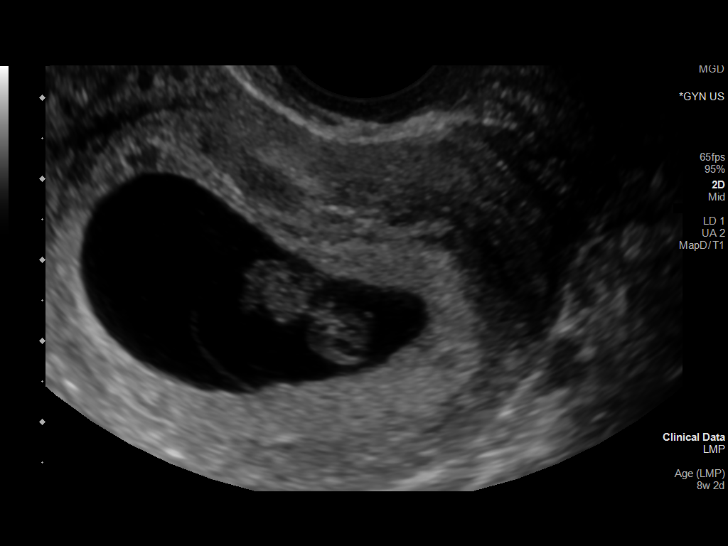
[im 18/53]
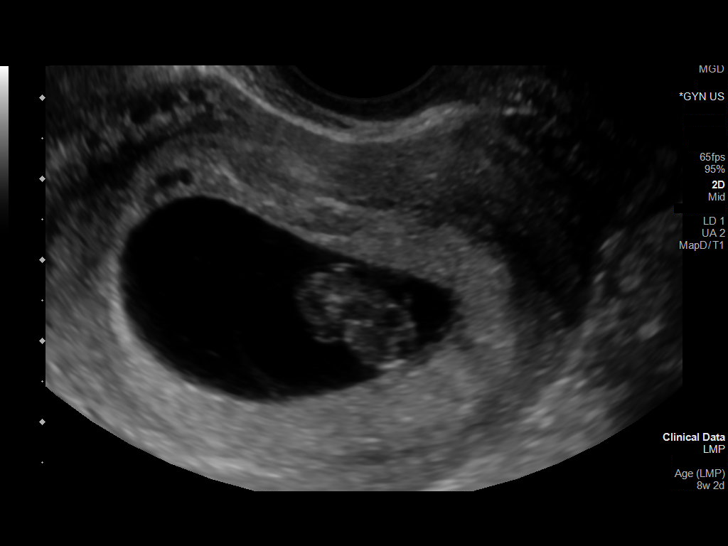
[im 22/53]
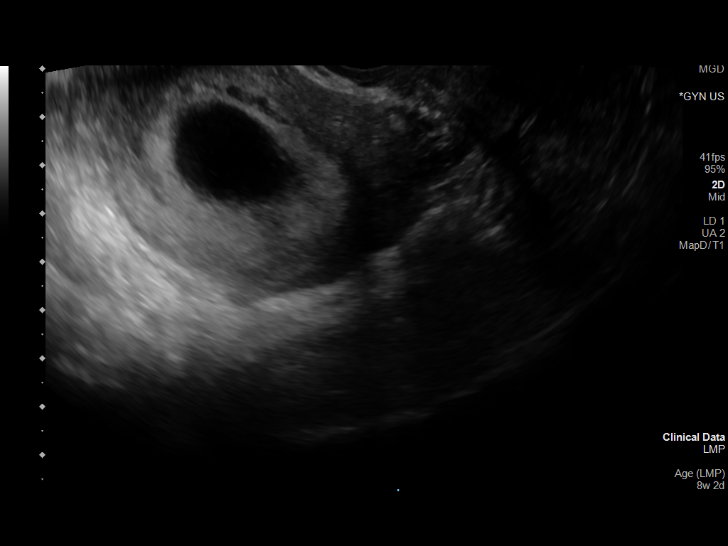
[im 26/53]
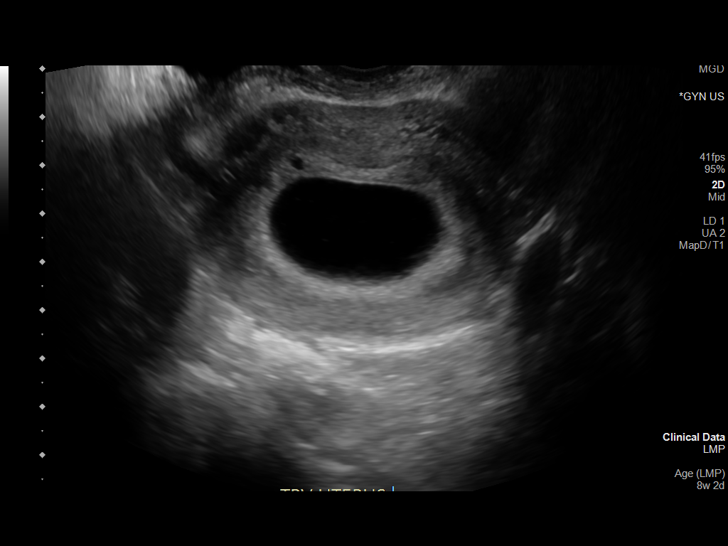
[im 29/53]
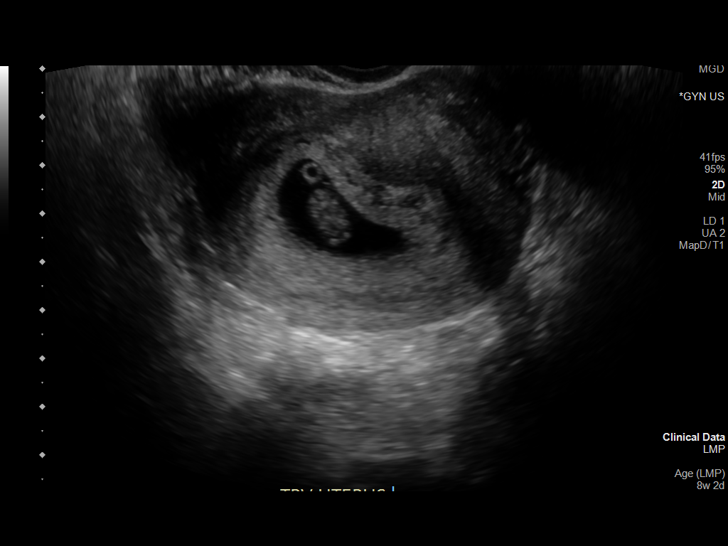
[im 33/53]
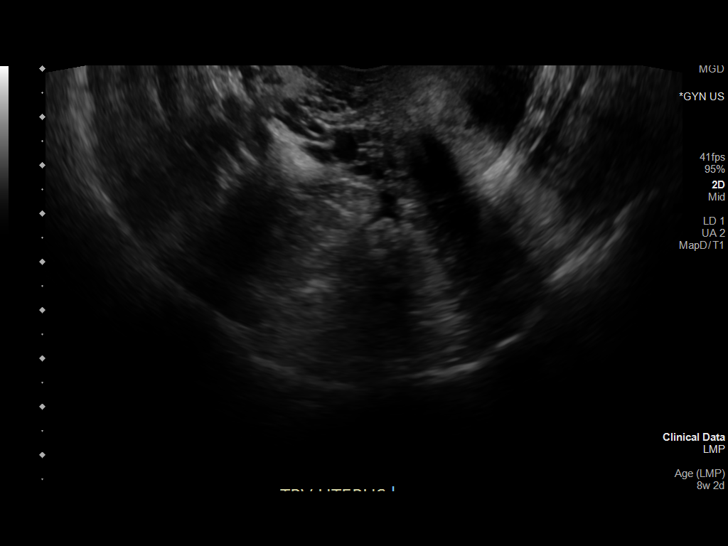
[im 37/53]
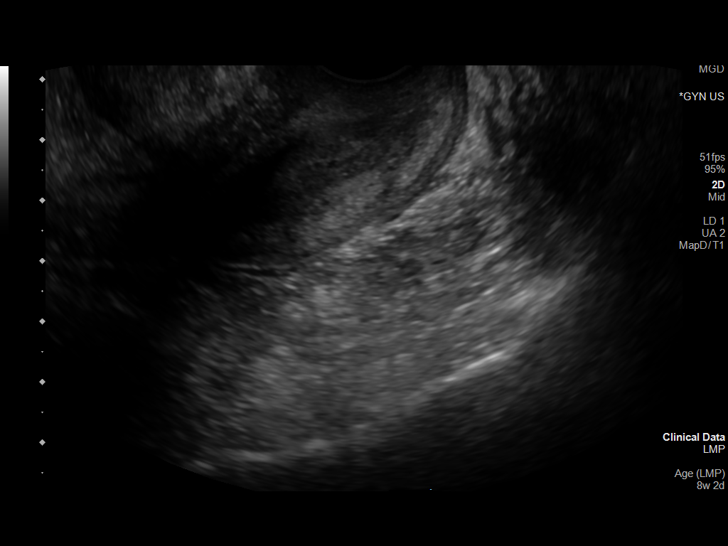
[im 41/53]
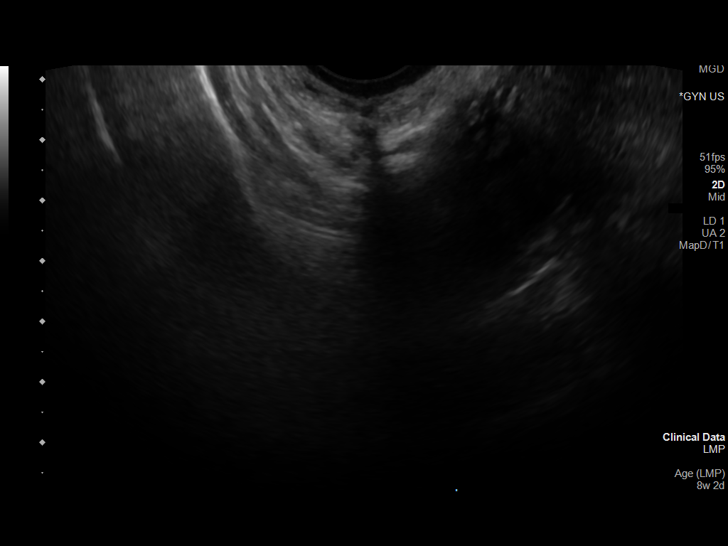
[im 45/53]
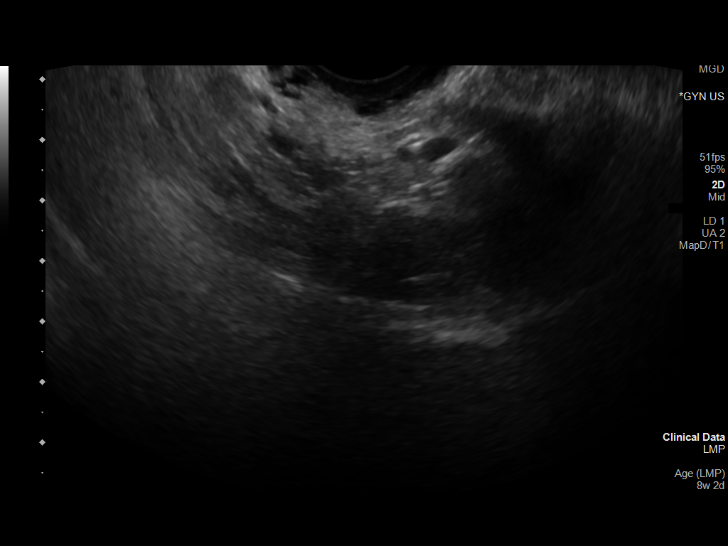
[im 49/53]
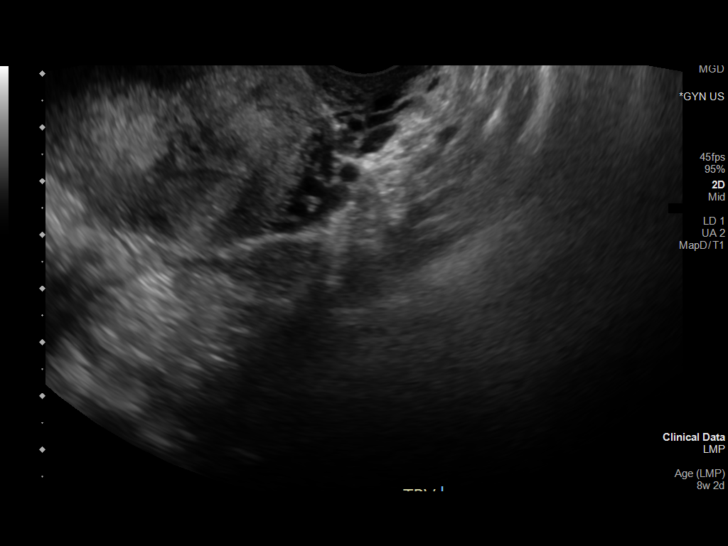
[im 53/53]
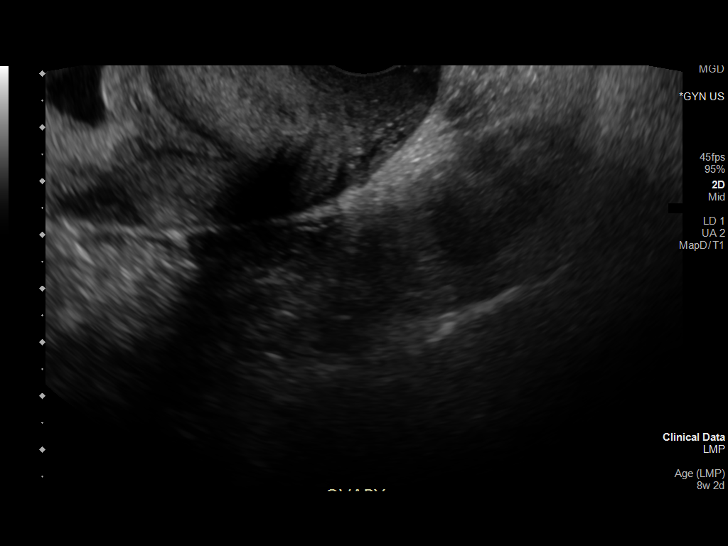

[14 of 28 positions shown; findings below may reference images not displayed]

FINDINGS: Intrauterine gestational sac: Single visualized.

Yolk sac:  Visualized.

Embryo:  Visualized.

Cardiac Activity: Visualized.

Heart Rate: 153-180 bpm

CRL: 19 mm   8 w 4 d                  US EDC: 01/02/2022

Subchorionic hemorrhage:  None visualized.

Maternal uterus/adnexae: Right ovary not visualized. Left ovary is
normal. 0.6 cm corpus luteal cyst over the left ovary. No free
pelvic fluid.
IMPRESSION: Single live IUP with estimated gestational age 8 weeks 4 days.
